# Patient Record
Sex: Female | Born: 1970 | Race: Black or African American | Hispanic: No | Smoking: Former smoker
Health system: Southern US, Community
[De-identification: ages and names within clinical notes are randomized; demographics above are authoritative.]

## PROBLEM LIST (undated history)

## (undated) ENCOUNTER — Ambulatory Visit: Discharge status: Absent without leave | Payer: BLUE CROSS/BLUE SHIELD

## (undated) DIAGNOSIS — T7840XA Allergy, unspecified, initial encounter: Secondary | ICD-10-CM

## (undated) DIAGNOSIS — O24419 Gestational diabetes mellitus in pregnancy, unspecified control: Secondary | ICD-10-CM

## (undated) DIAGNOSIS — F429 Obsessive-compulsive disorder, unspecified: Secondary | ICD-10-CM

## (undated) DIAGNOSIS — B999 Unspecified infectious disease: Secondary | ICD-10-CM

## (undated) DIAGNOSIS — G629 Polyneuropathy, unspecified: Secondary | ICD-10-CM

## (undated) DIAGNOSIS — N83209 Unspecified ovarian cyst, unspecified side: Secondary | ICD-10-CM

## (undated) DIAGNOSIS — J45909 Unspecified asthma, uncomplicated: Secondary | ICD-10-CM

## (undated) DIAGNOSIS — E785 Hyperlipidemia, unspecified: Secondary | ICD-10-CM

## (undated) DIAGNOSIS — E669 Obesity, unspecified: Secondary | ICD-10-CM

## (undated) DIAGNOSIS — E119 Type 2 diabetes mellitus without complications: Secondary | ICD-10-CM

## (undated) DIAGNOSIS — F32A Depression, unspecified: Secondary | ICD-10-CM

## (undated) DIAGNOSIS — F329 Major depressive disorder, single episode, unspecified: Secondary | ICD-10-CM

## (undated) DIAGNOSIS — F419 Anxiety disorder, unspecified: Secondary | ICD-10-CM

## (undated) DIAGNOSIS — K219 Gastro-esophageal reflux disease without esophagitis: Secondary | ICD-10-CM

## (undated) DIAGNOSIS — F988 Other specified behavioral and emotional disorders with onset usually occurring in childhood and adolescence: Secondary | ICD-10-CM

## (undated) HISTORY — DX: Other specified behavioral and emotional disorders with onset usually occurring in childhood and adolescence: F98.8

## (undated) HISTORY — DX: Unspecified asthma, uncomplicated: J45.909

## (undated) HISTORY — DX: Hyperlipidemia, unspecified: E78.5

## (undated) HISTORY — DX: Gastro-esophageal reflux disease without esophagitis: K21.9

## (undated) HISTORY — DX: Type 2 diabetes mellitus without complications: E11.9

## (undated) HISTORY — DX: Polyneuropathy, unspecified: G62.9

## (undated) HISTORY — DX: Major depressive disorder, single episode, unspecified: F32.9

## (undated) HISTORY — PX: WISDOM TOOTH EXTRACTION: SHX21

## (undated) HISTORY — DX: Depression, unspecified: F32.A

## (undated) HISTORY — DX: Obesity, unspecified: E66.9

## (undated) HISTORY — DX: Allergy, unspecified, initial encounter: T78.40XA

---

## 1998-05-02 ENCOUNTER — Encounter: Admission: RE | Admit: 1998-05-02 | Discharge: 1998-07-31 | Payer: Self-pay | Admitting: *Deleted

## 1998-08-01 ENCOUNTER — Other Ambulatory Visit: Admission: RE | Admit: 1998-08-01 | Discharge: 1998-08-01 | Payer: Self-pay | Admitting: *Deleted

## 1999-02-11 ENCOUNTER — Emergency Department (HOSPITAL_COMMUNITY): Admission: EM | Admit: 1999-02-11 | Discharge: 1999-02-12 | Payer: Self-pay | Admitting: Emergency Medicine

## 2000-07-05 ENCOUNTER — Encounter: Payer: Self-pay | Admitting: Emergency Medicine

## 2000-07-05 ENCOUNTER — Emergency Department (HOSPITAL_COMMUNITY): Admission: EM | Admit: 2000-07-05 | Discharge: 2000-07-05 | Payer: Self-pay | Admitting: Emergency Medicine

## 2000-08-02 ENCOUNTER — Ambulatory Visit (HOSPITAL_BASED_OUTPATIENT_CLINIC_OR_DEPARTMENT_OTHER): Admission: RE | Admit: 2000-08-02 | Discharge: 2000-08-02 | Payer: Self-pay | Admitting: Internal Medicine

## 2001-08-09 ENCOUNTER — Other Ambulatory Visit: Admission: RE | Admit: 2001-08-09 | Discharge: 2001-08-09 | Payer: Self-pay | Admitting: *Deleted

## 2001-08-21 ENCOUNTER — Encounter: Payer: Self-pay | Admitting: Obstetrics & Gynecology

## 2001-08-21 ENCOUNTER — Inpatient Hospital Stay (HOSPITAL_COMMUNITY): Admission: AD | Admit: 2001-08-21 | Discharge: 2001-08-21 | Payer: Self-pay | Admitting: Obstetrics & Gynecology

## 2002-04-07 ENCOUNTER — Other Ambulatory Visit: Admission: RE | Admit: 2002-04-07 | Discharge: 2002-04-07 | Payer: Self-pay | Admitting: Internal Medicine

## 2002-11-24 ENCOUNTER — Encounter: Admission: RE | Admit: 2002-11-24 | Discharge: 2002-11-24 | Payer: Self-pay | Admitting: Otolaryngology

## 2002-11-24 ENCOUNTER — Encounter: Payer: Self-pay | Admitting: Otolaryngology

## 2004-05-01 ENCOUNTER — Other Ambulatory Visit: Admission: RE | Admit: 2004-05-01 | Discharge: 2004-05-01 | Payer: Self-pay | Admitting: Internal Medicine

## 2004-12-29 ENCOUNTER — Ambulatory Visit (HOSPITAL_COMMUNITY): Admission: RE | Admit: 2004-12-29 | Discharge: 2004-12-29 | Payer: Self-pay | Admitting: Internal Medicine

## 2005-05-18 ENCOUNTER — Other Ambulatory Visit: Admission: RE | Admit: 2005-05-18 | Discharge: 2005-05-18 | Payer: Self-pay | Admitting: Internal Medicine

## 2006-05-16 ENCOUNTER — Emergency Department (HOSPITAL_COMMUNITY): Admission: EM | Admit: 2006-05-16 | Discharge: 2006-05-16 | Payer: Self-pay | Admitting: Emergency Medicine

## 2006-07-16 ENCOUNTER — Ambulatory Visit (HOSPITAL_COMMUNITY): Admission: RE | Admit: 2006-07-16 | Discharge: 2006-07-16 | Payer: Self-pay | Admitting: Internal Medicine

## 2006-09-23 ENCOUNTER — Ambulatory Visit: Payer: Self-pay | Admitting: *Deleted

## 2006-09-23 ENCOUNTER — Inpatient Hospital Stay (HOSPITAL_COMMUNITY): Admission: RE | Admit: 2006-09-23 | Discharge: 2006-09-25 | Payer: Self-pay | Admitting: *Deleted

## 2006-10-23 ENCOUNTER — Inpatient Hospital Stay (HOSPITAL_COMMUNITY): Admission: AD | Admit: 2006-10-23 | Discharge: 2006-10-23 | Payer: Self-pay | Admitting: Obstetrics and Gynecology

## 2006-10-24 ENCOUNTER — Emergency Department (HOSPITAL_COMMUNITY): Admission: EM | Admit: 2006-10-24 | Discharge: 2006-10-24 | Payer: Self-pay | Admitting: Emergency Medicine

## 2007-05-10 ENCOUNTER — Other Ambulatory Visit: Admission: RE | Admit: 2007-05-10 | Discharge: 2007-05-10 | Payer: Self-pay | Admitting: Internal Medicine

## 2007-08-08 ENCOUNTER — Emergency Department (HOSPITAL_COMMUNITY): Admission: EM | Admit: 2007-08-08 | Discharge: 2007-08-08 | Payer: Self-pay | Admitting: *Deleted

## 2007-12-10 ENCOUNTER — Emergency Department (HOSPITAL_COMMUNITY): Admission: EM | Admit: 2007-12-10 | Discharge: 2007-12-10 | Payer: Self-pay | Admitting: Emergency Medicine

## 2008-02-13 ENCOUNTER — Emergency Department (HOSPITAL_COMMUNITY): Admission: EM | Admit: 2008-02-13 | Discharge: 2008-02-13 | Payer: Self-pay | Admitting: Emergency Medicine

## 2008-07-19 ENCOUNTER — Emergency Department (HOSPITAL_COMMUNITY): Admission: EM | Admit: 2008-07-19 | Discharge: 2008-07-19 | Payer: Self-pay | Admitting: Emergency Medicine

## 2008-09-30 ENCOUNTER — Inpatient Hospital Stay (HOSPITAL_COMMUNITY): Admission: AD | Admit: 2008-09-30 | Discharge: 2008-09-30 | Payer: Self-pay | Admitting: Obstetrics

## 2009-03-19 ENCOUNTER — Encounter: Admission: RE | Admit: 2009-03-19 | Discharge: 2009-03-19 | Payer: Self-pay | Admitting: Obstetrics and Gynecology

## 2009-05-25 ENCOUNTER — Inpatient Hospital Stay (HOSPITAL_COMMUNITY): Admission: AD | Admit: 2009-05-25 | Discharge: 2009-05-27 | Payer: Self-pay | Admitting: Obstetrics and Gynecology

## 2009-07-19 HISTORY — PX: OOPHORECTOMY: SHX86

## 2009-08-05 ENCOUNTER — Ambulatory Visit (HOSPITAL_COMMUNITY): Admission: RE | Admit: 2009-08-05 | Discharge: 2009-08-06 | Payer: Self-pay | Admitting: Obstetrics and Gynecology

## 2009-08-05 ENCOUNTER — Encounter (INDEPENDENT_AMBULATORY_CARE_PROVIDER_SITE_OTHER): Payer: Self-pay | Admitting: Obstetrics and Gynecology

## 2009-11-20 ENCOUNTER — Ambulatory Visit (HOSPITAL_COMMUNITY): Admission: RE | Admit: 2009-11-20 | Discharge: 2009-11-20 | Payer: Self-pay | Admitting: Obstetrics and Gynecology

## 2010-04-18 ENCOUNTER — Emergency Department (HOSPITAL_COMMUNITY): Admission: EM | Admit: 2010-04-18 | Discharge: 2010-04-18 | Payer: Self-pay | Admitting: Emergency Medicine

## 2011-01-01 ENCOUNTER — Other Ambulatory Visit (HOSPITAL_COMMUNITY): Payer: Self-pay | Admitting: Internal Medicine

## 2011-01-01 DIAGNOSIS — Z1231 Encounter for screening mammogram for malignant neoplasm of breast: Secondary | ICD-10-CM

## 2011-01-02 ENCOUNTER — Ambulatory Visit (HOSPITAL_COMMUNITY)
Admission: RE | Admit: 2011-01-02 | Discharge: 2011-01-02 | Disposition: A | Discharge status: Home / Self Care | Payer: PRIVATE HEALTH INSURANCE | Source: Ambulatory Visit | Attending: Internal Medicine | Admitting: Internal Medicine

## 2011-01-02 DIAGNOSIS — Z1231 Encounter for screening mammogram for malignant neoplasm of breast: Secondary | ICD-10-CM | POA: Insufficient documentation

## 2011-01-08 ENCOUNTER — Other Ambulatory Visit: Payer: Self-pay | Admitting: Internal Medicine

## 2011-01-08 DIAGNOSIS — R928 Other abnormal and inconclusive findings on diagnostic imaging of breast: Secondary | ICD-10-CM

## 2011-01-14 ENCOUNTER — Ambulatory Visit
Admission: RE | Admit: 2011-01-14 | Discharge: 2011-01-14 | Disposition: A | Discharge status: Home / Self Care | Payer: PRIVATE HEALTH INSURANCE | Source: Ambulatory Visit | Attending: Internal Medicine | Admitting: Internal Medicine

## 2011-01-14 DIAGNOSIS — R928 Other abnormal and inconclusive findings on diagnostic imaging of breast: Secondary | ICD-10-CM

## 2011-01-22 LAB — CBC
HCT: 33 % — ABNORMAL LOW (ref 36.0–46.0)
HCT: 36.9 % (ref 36.0–46.0)
Hemoglobin: 11 g/dL — ABNORMAL LOW (ref 12.0–15.0)
Hemoglobin: 12.2 g/dL (ref 12.0–15.0)
MCHC: 33.3 g/dL (ref 30.0–36.0)
MCHC: 33.1 g/dL (ref 30.0–36.0)
MCV: 82.6 fL (ref 78.0–100.0)
MCV: 82.8 fL (ref 78.0–100.0)
Platelets: 248 K/uL (ref 150–400)
Platelets: 258 K/uL (ref 150–400)
RBC: 4 MIL/uL (ref 3.87–5.11)
RBC: 4.46 MIL/uL (ref 3.87–5.11)
RDW: 14.4 % (ref 11.5–15.5)
RDW: 14.7 % (ref 11.5–15.5)
WBC: 10.8 K/uL — ABNORMAL HIGH (ref 4.0–10.5)
WBC: 6.2 K/uL (ref 4.0–10.5)

## 2011-01-22 LAB — BASIC METABOLIC PANEL WITH GFR
BUN: 9 mg/dL (ref 6–23)
CO2: 28 meq/L (ref 19–32)
Calcium: 8.5 mg/dL (ref 8.4–10.5)
Chloride: 104 meq/L (ref 96–112)
Creatinine, Ser: 0.75 mg/dL (ref 0.4–1.2)
GFR calc non Af Amer: >60 mL/min
Glucose, Bld: 115 mg/dL — ABNORMAL HIGH (ref 70–99)
Potassium: 4.1 meq/L (ref 3.5–5.1)
Sodium: 136 meq/L (ref 135–145)

## 2011-01-22 LAB — PREGNANCY, URINE: Preg Test, Ur: NEGATIVE

## 2011-01-24 LAB — GLUCOSE, CAPILLARY
Glucose-Capillary: 117 mg/dL — ABNORMAL HIGH (ref 70–99)
Glucose-Capillary: 71 mg/dL (ref 70–99)

## 2011-01-24 LAB — CBC
HCT: 33.1 % — ABNORMAL LOW (ref 36.0–46.0)
Hemoglobin: 12.2 g/dL (ref 12.0–15.0)
MCV: 83.7 fL (ref 78.0–100.0)
Platelets: 200 10*3/uL (ref 150–400)
RBC: 3.92 MIL/uL (ref 3.87–5.11)
RDW: 14.9 % (ref 11.5–15.5)

## 2011-01-24 LAB — RPR: RPR Ser Ql: NONREACTIVE

## 2011-03-03 NOTE — Discharge Summary (Signed)
Bridget Watson, Bridget Watson              ACCOUNT NO.:  [id number]   MEDICAL RECORD NO.:  [id number]          PATIENT TYPE:  INP   LOCATION:  9117                          FACILITY:  [facility]   PHYSICIAN:  Sherron Monday, MD        DATE OF BIRTH:  03/08/1971   DATE OF ADMISSION:  05/25/2009  DATE OF DISCHARGE:  05/27/2009                               DISCHARGE SUMMARY   ADMITTING DIAGNOSIS:  Intrauterine pregnancy at term in labor.   DISCHARGE DIAGNOSIS:  Intrauterine pregnancy at term in labor,  delivered.   HISTORY OF PRESENT ILLNESS:  A 40 year old, G5, P2-0-2-2, at 1 plus  weeks in labor.  She states she has had good fetal movement, no loss of  fluid, no vaginal bleeding, and contractions since approximately 8:00  p.m., increasing in intensity and frequency.  Her pregnancy has been  complicated by gestational diabetes mellitus and AMA.  She is on  glyburide for the gestational diabetes and has had fairly good control.   Past medical history is significant for anxiety, low back pain, and  morbid obesity.   Past surgical history is significant for a cyst removed from her axilla.   PAST GYNECOLOGIC HISTORY:  G1 was an 9 of 7-pound 11-ounce female  infant.  G2 was a miscarriage.  G3 was in 1996, 8-pound 5-ounce female  infant, pregnancy complicated by deceased from diabetes and preterm  labor.  G4 is a miscarriage.  G5 is the present pregnancy.  No abnormal  Pap smears or sexually transmitted diseases.   Medications include glyburide, prenatal vitamins, and Protonix.   Allergies are to [facility].   SOCIAL HISTORY:  Denies alcohol or drugs.  Admits to smoking, but quit  with the pregnancy.  She is married.   Family history is significant for coronary artery disease in the mother,  hypertension in mother and grandmother, anemia in mother, diabetes in  the son, mother, and 2 brothers, ovarian cancer in a sister, breast  cancer in a sister, father with colon cancer, mother with depression.   PRENATAL LABORATORY DATA:  Hemoglobin 11.9, platelets 259,000.  A  positive.  Antibody screen negative.  Group B strep positive.  Gonorrhea  negative.  Chlamydia negative.  RPR nonreactive.  Rubella immune.  Declines cystic fibrosis screening.  Hepatitis B surface antigen  negative.  HIV negative.  First trimester screen and AFP within normal  limits.  Early 1-hour Glucola of 180 with an elevated 3-hour.  Decision  was made to follow sugars given her history and her care, she had on  several ultrasounds normal nuchal thickness.  An 18-week ultrasound  revealed normal anatomy, limited heart anatomy, low lying placenta, and  a female infant.  Repeat scan at 32 weeks revealed normal AFI, posterior  placenta resolved, low lying.   On admission, afebrile, vital signs stable with normal CBG.  Benign  exam.  Fetal heart tones in the 130s with irregular contractions.  She  had SROM at 6:00 a.m. for light  Mec.  She was given penicillin for  group B strep prophylaxis.  She had her epidural placed, and  the baby  had decel secondary to the hypotension.  At this time, she responded  well to position change and ephedrine.  She was 9-10, completely  effaced, and +1 station.  She progressed rapidly to complete +3, pushed  for several contractions and she delivered a viable female infant at 7:56  a.m. with Apgars of 9 at 1 minute and 9 at 5 minutes and a weight of 7  pounds 7 ounces.  Placenta was manually extracted at 8:02, inspected and  found to be complete.  OB laceration was periclitoral, repaired with 3-0  Vicryl Rapide in the typical fashion.  Estimated blood loss was less  than 500 mL.  She received Zosyn for the manual extraction of placenta  for 24 hours.  Her postpartum course was relatively uncomplicated.  She  remained afebrile, and vital signs stable throughout.  Her hemoglobin  decreased from 12.2 to 11.3.  Her fingersticks were followed and were  within normal limits.  She will be discharged  home.  She states she has  had normal lochia.  Her pain is well controlled.  She is ambulating,  voiding without difficulty.  She was given routine discharge  instructions and numbers to call with any questions or problems.  She  voices understanding to all this.  She was given prescriptions for  Motrin, Vicodin, prenatal vitamins.  She will follow up in the office in  approximately 6 weeks.  At this time, we will perform a 2-hour GCT.  She  plans to both breast and bottle feed, use Depo for contraception.  She  will receive her first injection prior to discharge.  She is A positive,  rubella immune.  Hemoglobin decreased from 12.2 to 11.3.      Sherron Monday, MD  Electronically Signed     JB/MEDQ  D:  05/27/2009  T:  05/28/2009  Job:  [id number]

## 2011-03-06 NOTE — Discharge Summary (Signed)
Bridget Watson, Bridget Watson NO.:  [id number]   MEDICAL RECORD NO.:  [id number]          PATIENT TYPE:  IPS   LOCATION:  0301                          FACILITY:  [facility]   PHYSICIAN:  Jasmine Pang, M.D. DATE OF BIRTH:  March 30, 1971   DATE OF ADMISSION:  09/23/2006  DATE OF DISCHARGE:  09/25/2006                               DISCHARGE SUMMARY   IDENTIFICATION:  This is a 40 year old divorced African-American female  who was admitted on a voluntary basis on September 23, 2006.   HISTORY OF PRESENT ILLNESS:  The patient has a history of increased  stress.  She is overwhelmed with work.  She has broken up with a  boyfriend who is getting ready to be deployed to [place].  She has been  having suicidal ideations.  She is tired of being the strong me.  She  is tired of fighting (with her boyfriend).  She reported no specific  plan but had just begun to have some suicidal ideation.  Her children  are her deterrent.  She has two children.  She has been sleeping 3-4  hours a day.  She takes on extra work where she works at an [employer] and stays very busy.  Today, she took some extra Xanax to cope  but just to cope.  Recently, there has been an increase in alcohol  intake in the past 3-4 months.  She has approximately one gallon per  week.  Her last drink was the day before admission.  This is her first  [facility] admission.  She has no outpatient treatment.  She is on Xanax (?  dose) and had been on Prozac in the past.  She had been on Wellbutrin at  one point and stated this worked better but she could not afford it.  She was willing to go back on it, however.  For further admission  information, see psychiatric admission assessment.   PHYSICAL EXAMINATION:  The patient had no acute medical problems.  She  was a healthy female who was obese (5 feet 2 inches and 233.75 pounds).   LABORATORY DATA:  CBC was within normal limits.  CMET was within normal  limits.  TSH was 1.105  which was within normal limits.   HOSPITAL COURSE:  Upon admission, the patient was started on Wellbutrin  XL 150 mg p.o. q.d.  She was also started on the Librium detox protocol  and trazodone 50 mg p.o. q.h.s. p.r.n. q.6h. while awake.  The patient  tolerated her medications well with no significant side effects.  When I  met with the patient, on September 24, 2006, she discussed numerous  stressors, work, school, being a single mother, boyfriend being deployed  to [place], conflict between them.  She also said her sister has terminal  ovarian cancer.  After an argument with her boyfriend on the day of  admission, she called her [facility] program.  She told them she had suicidal  thoughts but denies being actively suicidal.  EAP had the EMTs come to  take her to the emergency room.  She stated she has  not been sleeping  well and she has had a decreased appetite.  On September 25, 2006, mental  status exam had improved markedly.  She was friendly, cooperative with  good eye contact.  Speech normal rate and flow.  Psychomotor activity  within normal limits.  Mood was euthymic.  Affect wide range.  There was  no suicidal or homicidal ideation.  No thoughts of self-injurious  behavior.  No auditory or visual hallucinations.  No paranoia or  delusions.  Thoughts were logical and goal-directed.  Thought content no  predominant theme.  The cognitive exam was within normal limits, back to  baseline.  It was felt the patient was stable enough to be discharged  today and begin treatment on an outpatient basis at the [facility].   DISCHARGE DIAGNOSES:  AXIS I:  Major depressive disorder, single  episode, severe without psychosis.  Alcohol abuse.  AXIS II:  None.  AXIS III:  No acute or chronic health problems.  AXIS IV:  Severe (problems with primary support group, problems related  to social environment, occupational problem, economic problem, other  psychosocial problems).  AXIS  V:   ACTIVITY/DIET:  There were no specific activity level or dietary  restrictions.   DISCHARGE MEDICATIONS:  1. Wellbutrin XL 300 mg p.o. daily.  2. Librium 25 mg, 1 pill twice daily on Sunday, September 26, 2006; then      1 pill x1 on Monday, September 27, 2006, then discontinue.   POST-HOSPITAL CARE PLANS:  The patient will be seen at the [facility] by Saul Fordyce, nurse practitioner on Friday,  October 01, 2006 at 9 a.m. for her medication management.  Once there,  they will schedule her with a therapist in their office to begin regular  therapy.      Jasmine Pang, M.D.  Electronically Signed     BHS/MEDQ  D:  09/25/2006  T:  09/25/2006  Job:  [id number]

## 2011-07-23 LAB — CBC
HCT: 36.5 % (ref 36.0–46.0)
MCV: 84.6 fL (ref 78.0–100.0)
RBC: 4.31 MIL/uL (ref 3.87–5.11)
WBC: 7.9 10*3/uL (ref 4.0–10.5)

## 2011-07-23 LAB — ABO/RH: ABO/RH(D): A POS

## 2011-12-17 ENCOUNTER — Other Ambulatory Visit (HOSPITAL_COMMUNITY): Payer: Self-pay | Admitting: Internal Medicine

## 2011-12-17 DIAGNOSIS — Z1231 Encounter for screening mammogram for malignant neoplasm of breast: Secondary | ICD-10-CM

## 2012-01-15 ENCOUNTER — Ambulatory Visit
Admission: RE | Admit: 2012-01-15 | Discharge: 2012-01-15 | Disposition: A | Discharge status: Home / Self Care | Payer: PRIVATE HEALTH INSURANCE | Source: Ambulatory Visit | Attending: Internal Medicine | Admitting: Internal Medicine

## 2012-01-15 DIAGNOSIS — Z1231 Encounter for screening mammogram for malignant neoplasm of breast: Secondary | ICD-10-CM

## 2012-05-17 ENCOUNTER — Telehealth: Payer: Self-pay | Admitting: *Deleted

## 2012-05-17 NOTE — Telephone Encounter (Signed)
Left message for pt to return my call so I can schedule a genetic appt.  

## 2012-05-23 ENCOUNTER — Telehealth: Payer: Self-pay | Admitting: *Deleted

## 2012-05-23 NOTE — Telephone Encounter (Signed)
Left message for pt to return my call so I can schedule a genetic appt.  

## 2012-05-24 ENCOUNTER — Telehealth: Payer: Self-pay | Admitting: *Deleted

## 2012-05-24 NOTE — Telephone Encounter (Signed)
Patient returned my call and I confirmed 07/21/12 genetic appt w/ pt.  Called Brandi at referring to make her aware.  Took paperwork to Clydie Braun.

## 2012-07-21 ENCOUNTER — Encounter: Payer: Self-pay | Admitting: Genetic Counselor

## 2012-07-21 ENCOUNTER — Ambulatory Visit (HOSPITAL_BASED_OUTPATIENT_CLINIC_OR_DEPARTMENT_OTHER): Payer: PRIVATE HEALTH INSURANCE | Admitting: Genetic Counselor

## 2012-07-21 ENCOUNTER — Other Ambulatory Visit: Payer: BC Managed Care – PPO | Admitting: Lab

## 2012-07-21 DIAGNOSIS — Z8041 Family history of malignant neoplasm of ovary: Secondary | ICD-10-CM

## 2012-07-21 DIAGNOSIS — IMO0002 Reserved for concepts with insufficient information to code with codable children: Secondary | ICD-10-CM

## 2012-07-21 DIAGNOSIS — Z803 Family history of malignant neoplasm of breast: Secondary | ICD-10-CM

## 2012-07-21 NOTE — Progress Notes (Signed)
Dr. Sherron Monday requested a consultation for genetic counseling and risk assessment for Bridget Watson, a 41 y.o. female, for discussion of her family history of breast, ovarian, prostate and stomach cancer. She presents to clinic today to discuss the possibility of a genetic predisposition to cancer, and to further clarify her risks, as well as her family members' risks for cancer.   HISTORY OF PRESENT ILLNESS: Bridget Watson is a 41 y.o. female with no personal history of cancer.    History reviewed. No pertinent past medical history.  History reviewed. No pertinent past surgical history.  History  Substance Use Topics  . Smoking status: Current Some Day Smoker -- 0.2 packs/day    Types: Cigarettes  . Smokeless tobacco: Not on file  . Alcohol Use: 2.4 oz/week    4 Glasses of wine per week     4-5 glasses of wine per week    REPRODUCTIVE HISTORY AND PERSONAL RISK ASSESSMENT FACTORS: Menarche was at age 15-12.   Premenopausal Uterus Intact: Yes Ovaries Intact: Yes, right ovary; left ovary removed as a result of ovarian cysts G3P3A0 , first live birth at age 8  She has not previously undergone treatment for infertility.   OCP use for 10 years, and depo-provera use for 10 years   She has not used HRT in the past.    FAMILY HISTORY:  We obtained a detailed, 4-generation family history.  Significant diagnoses are listed below: Family History  Problem Relation Age of Onset  . Breast cancer Sister 79  . Breast cancer Sister     late 56s  . Prostate cancer Father   . Healthy Sister   . Stomach cancer Brother 18  The patient has five brothers and three sisters.  One sister was diagnosed with breast cancer at age 62 and died at 36, another sister was diagnosed with ovarian cancer in her late 19s and died at 84, and her third sister is healthy.  One of her brothers has been diagnosed with stomach cancer at age 39-48.  The patient's mother is alive and well at age 60.  Her mother  has two brothers and a sister.  One brother had an unknown form of cancer and died a few years ago.  The patient's father was diagnosed with prostate cancer and died in his 24s.  Of note, the patient indicated that it is unclear if he is her biological father.  He had 11 brothers and sisters.  There is no other reported family history of cancer on this side of the family.  Patient's maternal ancestors are of [place] descent, and paternal ancestors are of [place] descent. There is no reported Ashkenazi Jewish ancestry. There is no  known consanguinity.  GENETIC COUNSELING RISK ASSESSMENT, DISCUSSION, AND SUGGESTED FOLLOW UP: We reviewed the natural history and genetic etiology of sporadic, familial and hereditary cancer syndromes.  About 5-10% of breast cancer is hereditary.  Of this, about 85% is the result of a BRCA1 or BRCA2 mutation.  We reviewed the red flags of hereditary cancer syndromes and the dominant inheritance patterns.  If the BRCA testing is negative, we discussed that we could be testing for the wrong gene.  We discussed gene panels, and that several cancer genes that are associated with different cancers can be tested at the same time.  Because of the different types of cancer that are in the patient's family, we will consider the CancerNext panel. We  will also consider a referral to the high risk clinic based on her lifetime risk for breast cancer.  The patient's family history of breast, ovarian, prostate and stomach cancer is suggestive of the following possible diagnosis: Hereditary cancer syndrome  We discussed that identification of a hereditary cancer syndrome may help her care providers tailor the patients medical management. If a mutation indicating a hereditary cancer syndrome is detected in this case, the [employer] recommendations would include increased cancer surveillance and possible  prophylactic surgery. If a mutation is detected, the patient will be referred back to the referring provider and to any additional appropriate care providers to discuss the relevant options.   If a mutation is not found in the patient, this will decrease the likelihood of a hereditary cancer syndrome as the explanation for her family history. Cancer surveillance options would be discussed for the patient according to the appropriate standard National Comprehensive Cancer Network and American Cancer Society guidelines, with consideration of their personal and family history risk factors. In this case, the patient will be referred back to their care providers for discussions of management.   In order to estimate her chance of having a BRCA1 or BRCA2 mutation, we used statistical models ([employer]) and laboratory data that take into account her personal medical history, family history and ancestry.  Because each model is different, there can be a lot of variability in the risks they give.  Therefore, these numbers must be considered a rough range and not a precise risk of having a BRCA1 or BRCA2 mutation.  These models estimate that she has approximately a 7-10% chance of having a mutation. Based on this assessment of her family and personal history, genetic testing is recommended.  Based on the patient's personal and family history, statistical models (Tyrer cusik)  and literature data were used to estimate her risk of developing breast cancer. These estimate her lifetime risk of developing breast cancer to be approximately 21.1%. This estimation does not take into account any genetic testing results.   After considering the risks, benefits, and limitations, the patient provided informed consent for  the following  testing: CancerNext through [employer]   Per the patient's request, we will contact her by telephone to discuss these results. A follow up genetic counseling visit will be  scheduled if indicated.  The patient was seen for a total of 60 minutes, greater than 50% of which was spent face-to-face counseling.  This plan is being carried out per Dr. Augusto Gamble Bovard's recommendations.  This note will also be sent to the referring provider via the electronic medical record. The patient will be supplied with a summary of this genetic counseling discussion as well as educational information on the discussed hereditary cancer syndromes following the conclusion of their visit.   Patient was discussed with Dr. Drue Second.  CC BY [place] MAIL: Sherron Monday, MD, [facility]   _______________________________________________________________________ For Office Staff:  Number of people involved in session: 2 Was an Intern/ student involved with case: no

## 2012-08-10 ENCOUNTER — Other Ambulatory Visit: Payer: Self-pay

## 2012-10-21 ENCOUNTER — Telehealth: Payer: Self-pay | Admitting: Genetic Counselor

## 2012-10-21 NOTE — Telephone Encounter (Signed)
Left message to call me back about test results.  No name on VM.

## 2012-10-28 ENCOUNTER — Encounter: Payer: Self-pay | Admitting: Genetic Counselor

## 2013-06-28 ENCOUNTER — Other Ambulatory Visit (HOSPITAL_COMMUNITY): Payer: Self-pay | Admitting: Internal Medicine

## 2013-06-28 ENCOUNTER — Ambulatory Visit (HOSPITAL_COMMUNITY)
Admission: RE | Admit: 2013-06-28 | Discharge: 2013-06-28 | Disposition: A | Discharge status: Home / Self Care | Source: Ambulatory Visit | Attending: Internal Medicine | Admitting: Internal Medicine

## 2013-06-28 DIAGNOSIS — M25519 Pain in unspecified shoulder: Secondary | ICD-10-CM | POA: Insufficient documentation

## 2013-06-28 DIAGNOSIS — M25512 Pain in left shoulder: Secondary | ICD-10-CM

## 2013-06-28 DIAGNOSIS — Q740 Other congenital malformations of upper limb(s), including shoulder girdle: Secondary | ICD-10-CM | POA: Insufficient documentation

## 2013-08-24 ENCOUNTER — Telehealth: Payer: Self-pay | Admitting: *Deleted

## 2013-08-24 MED ORDER — FLUVOXAMINE MALEATE ER 150 MG PO CP24: 150.0000 mg | ORAL_CAPSULE | Freq: Every day | ORAL | Status: DC

## 2013-08-24 MED ORDER — ESCITALOPRAM OXALATE 20 MG PO TABS: 20.0000 mg | ORAL_TABLET | Freq: Every day | ORAL | Status: DC

## 2013-08-24 MED ORDER — BUPROPION HCL ER (XL) 150 MG PO TB24: 150.0000 mg | ORAL_TABLET | ORAL | Status: DC

## 2013-08-24 NOTE — Telephone Encounter (Signed)
PT IS CALLING WOULD LIKE TO PICK UP WRITTEN RX'S FOR =  WELLBUTRIN 150MG   LEXAPRO 20MG   FLUVOXAMINE ER 150MG                             CALL PT WHEN READY

## 2013-10-04 ENCOUNTER — Encounter: Payer: Self-pay | Admitting: Internal Medicine

## 2013-10-04 DIAGNOSIS — T7840XA Allergy, unspecified, initial encounter: Secondary | ICD-10-CM | POA: Insufficient documentation

## 2013-10-04 DIAGNOSIS — F909 Attention-deficit hyperactivity disorder, unspecified type: Secondary | ICD-10-CM | POA: Insufficient documentation

## 2013-10-04 DIAGNOSIS — G629 Polyneuropathy, unspecified: Secondary | ICD-10-CM | POA: Insufficient documentation

## 2013-10-04 DIAGNOSIS — E785 Hyperlipidemia, unspecified: Secondary | ICD-10-CM | POA: Insufficient documentation

## 2013-10-04 DIAGNOSIS — E119 Type 2 diabetes mellitus without complications: Secondary | ICD-10-CM | POA: Insufficient documentation

## 2013-10-04 DIAGNOSIS — F329 Major depressive disorder, single episode, unspecified: Secondary | ICD-10-CM | POA: Insufficient documentation

## 2013-10-04 DIAGNOSIS — J45909 Unspecified asthma, uncomplicated: Secondary | ICD-10-CM | POA: Insufficient documentation

## 2013-10-04 DIAGNOSIS — K219 Gastro-esophageal reflux disease without esophagitis: Secondary | ICD-10-CM | POA: Insufficient documentation

## 2013-10-04 DIAGNOSIS — I10 Essential (primary) hypertension: Secondary | ICD-10-CM | POA: Insufficient documentation

## 2013-10-05 ENCOUNTER — Encounter: Payer: Self-pay | Admitting: Physician Assistant

## 2013-10-05 ENCOUNTER — Ambulatory Visit (INDEPENDENT_AMBULATORY_CARE_PROVIDER_SITE_OTHER): Admitting: Physician Assistant

## 2013-10-05 VITALS — BP 132/78 | HR 88 | Temp 98.1°F | Resp 18 | Ht 63.5 in | Wt 298.0 lb

## 2013-10-05 DIAGNOSIS — J019 Acute sinusitis, unspecified: Secondary | ICD-10-CM

## 2013-10-05 MED ORDER — AZITHROMYCIN 250 MG PO TABS: ORAL_TABLET | ORAL | Status: DC

## 2013-10-05 MED ORDER — DEXAMETHASONE SODIUM PHOSPHATE 100 MG/10ML IJ SOLN
10.0000 mg | Freq: Once | INTRAMUSCULAR | Status: AC
  Administered 2013-10-05: 10 mg via INTRAMUSCULAR

## 2013-10-05 MED ORDER — AZELASTINE HCL 0.1 % NA SOLN: NASAL | Status: DC

## 2013-10-05 NOTE — Progress Notes (Signed)
   Subjective:    Patient ID: Bridget Watson, female    DOB: 1971/10/13, 42 y.o.   MRN: [id number]  Sinus Problem This is a new problem. The current episode started in the past 7 days. The problem is unchanged. There has been no fever. Associated symptoms include congestion, coughing, ear pain, a hoarse voice and sinus pressure. Pertinent negatives include no chills, diaphoresis, headaches, neck pain, shortness of breath, sneezing, sore throat or swollen glands. Past treatments include oral decongestants (RX cough med). The treatment provided no relief.    Review of Systems  Constitutional: Negative for fever, chills, diaphoresis and fatigue.  HENT: Positive for congestion, ear pain, hoarse voice, nosebleeds, postnasal drip, rhinorrhea, sinus pressure and tinnitus. Negative for sneezing, sore throat and trouble swallowing.   Eyes: Negative.   Respiratory: Positive for cough. Negative for shortness of breath, wheezing and stridor.   Cardiovascular: Negative.   Gastrointestinal: Negative.   Musculoskeletal: Negative.  Negative for neck pain.  Neurological: Negative.  Negative for headaches.      Objective:   Physical Exam  Constitutional: She appears well-developed and well-nourished.  HENT:  Head: Normocephalic and atraumatic.  Right Ear: External ear normal.  Nose: Right sinus exhibits frontal sinus tenderness. Left sinus exhibits frontal sinus tenderness.  Eyes: Conjunctivae and EOM are normal.  Neck: Normal range of motion. Neck supple.  Cardiovascular: Normal rate, regular rhythm, normal heart sounds and intact distal pulses.   Pulmonary/Chest: Effort normal and breath sounds normal. No respiratory distress. She has no wheezes.  Abdominal: Soft. Bowel sounds are normal.  Skin: Skin is warm and dry.      Assessment & Plan:  Acute sinusitis, unspecified - Plan: azithromycin (ZITHROMAX) 250 MG tablet, azelastine (ASTELIN) 137 MCG/SPRAY nasal spray, dexamethasone (DECADRON)  injection 10 mg

## 2013-10-05 NOTE — Patient Instructions (Signed)

## 2013-10-23 ENCOUNTER — Other Ambulatory Visit: Payer: Self-pay | Admitting: Orthopedic Surgery

## 2013-10-23 DIAGNOSIS — M25512 Pain in left shoulder: Secondary | ICD-10-CM

## 2013-10-25 ENCOUNTER — Inpatient Hospital Stay: Admission: RE | Admit: 2013-10-25 | Payer: PRIVATE HEALTH INSURANCE | Source: Ambulatory Visit

## 2013-11-29 ENCOUNTER — Telehealth: Payer: Self-pay | Admitting: Emergency Medicine

## 2013-11-29 NOTE — Telephone Encounter (Signed)
Pt called today-11-29-2012 at 3;30=she went to [facility] today for appt on gastro by pass and while she was there she started having chest pains and they told her to go to the urgent care there and they did a EKG and told her to call her family doctor and get appt with them and then go to an cardio doctor=she does not have a cardio doctor= She was calling for the appt with amanda collier and asked pt was she still having chest pains and she said her chest still feels tight=talked to Loree Feem[place]issa smith and [place] said to make her appt tomarrow morning with us=made appt with Quentin Mullingamanda collier on 11/30/2013 at 9;30am. Loree FeeMelissa smith said for me(janie) to tell the pt if she still keeps having chest pains and/or if they get worst to go to the ER. TOLD PT THIS AND SHE  SAID SHE WOULD..Marland Kitchen

## 2013-11-30 ENCOUNTER — Encounter: Payer: Self-pay | Admitting: Physician Assistant

## 2013-11-30 ENCOUNTER — Ambulatory Visit (INDEPENDENT_AMBULATORY_CARE_PROVIDER_SITE_OTHER): Admitting: Physician Assistant

## 2013-11-30 VITALS — BP 116/64 | HR 98 | Temp 98.4°F | Resp 18 | Wt 296.0 lb

## 2013-11-30 DIAGNOSIS — R079 Chest pain, unspecified: Secondary | ICD-10-CM

## 2013-11-30 DIAGNOSIS — R0600 Dyspnea, unspecified: Secondary | ICD-10-CM

## 2013-11-30 DIAGNOSIS — F411 Generalized anxiety disorder: Secondary | ICD-10-CM

## 2013-11-30 DIAGNOSIS — K219 Gastro-esophageal reflux disease without esophagitis: Secondary | ICD-10-CM

## 2013-11-30 DIAGNOSIS — R0989 Other specified symptoms and signs involving the circulatory and respiratory systems: Secondary | ICD-10-CM

## 2013-11-30 DIAGNOSIS — R0609 Other forms of dyspnea: Secondary | ICD-10-CM

## 2013-11-30 DIAGNOSIS — I10 Essential (primary) hypertension: Secondary | ICD-10-CM

## 2013-11-30 LAB — BASIC METABOLIC PANEL WITH GFR
BUN: 12 mg/dL (ref 6–23)
CO2: 28 meq/L (ref 19–32)
Calcium: 9 mg/dL (ref 8.4–10.5)
Chloride: 109 mEq/L (ref 96–112)
Creat: 0.88 mg/dL (ref 0.50–1.10)
GFR, Est Non African American: 82 mL/min
GLUCOSE: 87 mg/dL (ref 70–99)
POTASSIUM: 4.1 meq/L (ref 3.5–5.3)
SODIUM: 142 meq/L (ref 135–145)

## 2013-11-30 LAB — CBC WITH DIFFERENTIAL/PLATELET
BASOS PCT: 0 % (ref 0–1)
Basophils Absolute: 0 10*3/uL (ref 0.0–0.1)
Eosinophils Absolute: 0.1 10*3/uL (ref 0.0–0.7)
Eosinophils Relative: 2 % (ref 0–5)
HCT: 37.5 % (ref 36.0–46.0)
HEMOGLOBIN: 12.4 g/dL (ref 12.0–15.0)
LYMPHS ABS: 3.1 10*3/uL (ref 0.7–4.0)
Lymphocytes Relative: 43 % (ref 12–46)
MCH: 26.7 pg (ref 26.0–34.0)
MCHC: 33.1 g/dL (ref 30.0–36.0)
MCV: 80.8 fL (ref 78.0–100.0)
MONOS PCT: 6 % (ref 3–12)
Monocytes Absolute: 0.4 10*3/uL (ref 0.1–1.0)
NEUTROS ABS: 3.6 10*3/uL (ref 1.7–7.7)
NEUTROS PCT: 49 % (ref 43–77)
Platelets: 312 10*3/uL (ref 150–400)
RBC: 4.64 MIL/uL (ref 3.87–5.11)
RDW: 14.7 % (ref 11.5–15.5)
WBC: 7.2 10*3/uL (ref 4.0–10.5)

## 2013-11-30 LAB — HEPATIC FUNCTION PANEL
ALT: 11 U/L (ref 0–35)
AST: 12 U/L (ref 0–37)
Albumin: 3.8 g/dL (ref 3.5–5.2)
Alkaline Phosphatase: 43 U/L (ref 39–117)
BILIRUBIN DIRECT: 0.1 mg/dL (ref 0.0–0.3)
BILIRUBIN INDIRECT: 0.2 mg/dL (ref 0.2–1.2)
BILIRUBIN TOTAL: 0.3 mg/dL (ref 0.2–1.2)
Total Protein: 6.5 g/dL (ref 6.0–8.3)

## 2013-11-30 MED ORDER — ALPRAZOLAM 1 MG PO TABS: 1.0000 mg | ORAL_TABLET | Freq: Three times a day (TID) | ORAL | Status: DC | PRN

## 2013-11-30 MED ORDER — NITROGLYCERIN 0.4 MG SL SUBL: 0.4000 mg | SUBLINGUAL_TABLET | SUBLINGUAL | Status: DC | PRN

## 2013-11-30 MED ORDER — BUPROPION HCL ER (XL) 300 MG PO TB24: 300.0000 mg | ORAL_TABLET | ORAL | Status: DC

## 2013-11-30 MED ORDER — MELOXICAM 15 MG PO TABS: ORAL_TABLET | ORAL | Status: DC

## 2013-11-30 MED ORDER — ESCITALOPRAM OXALATE 20 MG PO TABS: 20.0000 mg | ORAL_TABLET | Freq: Every day | ORAL | Status: DC

## 2013-11-30 NOTE — Progress Notes (Addendum)
   Subjective:    Patient ID: Bridget Watson, female    DOB: 11/14/1970, 43 y.o.   MRN: [id number]  HPI 43 y.o. female obese, GERD, DMII, HTN, hyperlipidemia presents for intermittent chest pain. [facility], while driving down she felt localized left sided chest "squeezing" with no radiation, with SOB, no other accompaniments, did have nausea the night before and increase beltching. Denies eating any food or any exertion prior to going to [facility]. [facility], had normal labs and normal EKG/CXR.    She states she had a similar episode 2-3 months ago on a treadmill with the pressure, like shake going through straw, and got off the machine and felt better. She has been having increasing fatigue for 2-3 months, we did recently decrease her wellbutrin a few months ago. The chest discomfort is intermittent, worse with stress, she is crying currently. Worse with deep breath in, family history of DVT in mother, no recent long travel, surgery.   Review of Systems  Constitutional: Positive for fatigue. Negative for fever, chills and unexpected weight change.  HENT: Negative.   Eyes: Negative.   Respiratory: Positive for shortness of breath. Negative for cough, choking, chest tightness and wheezing.   Cardiovascular: Positive for chest pain. Negative for palpitations and leg swelling.  Gastrointestinal: Negative.   Genitourinary: Negative.   Musculoskeletal: Negative.   Neurological: Negative.   Hematological: Negative.   Psychiatric/Behavioral: Negative for suicidal ideas.       Objective:   Physical Exam  Constitutional: She is oriented to person, place, and time. She appears well-nourished.  Obese  HENT:  Head: Normocephalic and atraumatic.  Right Ear: External ear normal.  Left Ear: External ear normal.  Eyes: Conjunctivae and EOM are normal. Pupils are equal, round, and reactive to light.  Neck: Normal range of motion. Neck supple.  No thyromegaly present.  Cardiovascular: Normal rate, regular rhythm, normal heart sounds and intact distal pulses.  Exam reveals no gallop and no friction rub.   No murmur heard. Pulmonary/Chest: Effort normal and breath sounds normal. She has no wheezes. She exhibits tenderness (Left 2-4 intercostals).  Questionable due to body habitus- decreased breath sounds diffusely  Abdominal: Soft. Bowel sounds are normal. There is no tenderness.  Obese  Musculoskeletal: Normal range of motion.  Lymphadenopathy:    She has no cervical adenopathy.  Neurological: She is alert and oriented to person, place, and time. She has normal reflexes. No cranial nerve deficit.  Skin: Skin is warm and dry.      Assessment & Plan:  Atypical chest pain with SOB, Fatigue  1) with upcoming Gastric bypass will refer to cardio, though low prob- thought that she was   referred before, will check paper chart.   2) SOB, obese, smoker, inactive, with family history DVT- will get Dimer and if + get CTA  3) Anxiety- get on wellbutrin 300XL, xanax refilled  4) GERD- PPI samples given, prilosec 40mg  daily  5) ?costochondrititis- Mobic 15mg  QD  OVER 40 minutes of exam, counseling, chart review, referral performed   All of labs are normal except Ddimer 0.69. With risks and symptoms willl order CTA PE protocol. Patient will go to the ER if she is has more CP, SOB.

## 2013-11-30 NOTE — Patient Instructions (Signed)
Bad carbs also include fruit juice, alcohol, and sweet tea. These are empty calories that do not signal to your brain that you are full.   Please remember the good carbs are still carbs which convert into sugar. So please measure them out no more than 1/2-1 cup of rice, oatmeal, pasta, and beans.  Veggies are however free foods! Pile them on.   I like lean protein at every meal such as chicken, [place]urkey, pork chops, cottage cheese, etc. Just do not fry these meats and please center your meal around vegetable, the meats should be a side dish.   No all fruit is created equal. Please see the list below, the fruit at the bottom is higher in sugars than the fruit at the top   Chest Pain (Nonspecific) It is often hard to give a specific diagnosis for the cause of chest pain. There is always a chance that your pain could be related to something serious, such as a heart attack or a blood clot in the lungs. You need to follow up with your caregiver for further evaluation. CAUSES   Heartburn.  Pneumonia or bronchitis.  Anxiety or stress.  Inflammation around your heart (pericarditis) or lung (pleuritis or pleurisy).  A blood clot in the lung.  A collapsed lung (pneumothorax). It can develop suddenly on its own (spontaneous pneumothorax) or from injury (trauma) to the chest.  Shingles infection (herpes zoster virus). The chest wall is composed of bones, muscles, and cartilage. Any of these can be the source of the pain.  The bones can be bruised by injury.  The muscles or cartilage can be strained by coughing or overwork.  The cartilage can be affected by inflammation and become sore (costochondritis). DIAGNOSIS  Lab tests or other studies, such as X-rays, electrocardiography, stress testing, or cardiac imaging, may be needed to find the cause of your pain.  TREATMENT   Treatment depends on what may be causing your chest pain. Treatment may include:  Acid blockers for  heartburn.  Anti-inflammatory medicine.  Pain medicine for inflammatory conditions.  Antibiotics if an infection is present.  You may be advised to change lifestyle habits. This includes stopping smoking and avoiding alcohol, caffeine, and chocolate.  You may be advised to keep your head raised (elevated) when sleeping. This reduces the chance of acid going backward from your stomach into your esophagus.  Most of the time, nonspecific chest pain will improve within 2 to 3 days with rest and mild pain medicine. HOME CARE INSTRUCTIONS   If antibiotics were prescribed, take your antibiotics as directed. Finish them even if you start to feel better.  For the next few days, avoid physical activities that bring on chest pain. Continue physical activities as directed.  Do not smoke.  Avoid drinking alcohol.  Only take over-the-counter or prescription medicine for pain, discomfort, or fever as directed by your caregiver.  Follow your caregiver's suggestions for further testing if your chest pain does not go away.  Keep any follow-up appointments you made. If you do not go to an appointment, you could develop lasting (chronic) problems with pain. If there is any problem keeping an appointment, you must call to reschedule. SEEK MEDICAL CARE IF:   You think you are having problems from the medicine you are taking. Read your medicine instructions carefully.  Your chest pain does not go away, even after treatment.  You develop a rash with blisters on your chest. SEEK IMMEDIATE MEDICAL CARE IF:   You  have increased chest pain or pain that spreads to your arm, neck, jaw, back, or abdomen.  You develop shortness of breath, an increasing cough, or you are coughing up blood.  You have severe back or abdominal pain, feel nauseous, or vomit.  You develop severe weakness, fainting, or chills.  You have a fever. THIS IS AN EMERGENCY. Do not wait to see if the pain will go away. Get medical  help at once. Call your local emergency services (911 in [place] Do not drive yourself to the hospital. MAKE SURE YOU:   Understand these instructions.  Will watch your condition.  Will get help right away if you are not doing well or get worse. Document Released: 07/15/2005 Document Revised: 12/28/2011 Document Reviewed: 05/10/2008 [facility] Patient Information 2014 [place], [place].

## 2013-12-01 ENCOUNTER — Encounter (HOSPITAL_COMMUNITY): Payer: Self-pay | Admitting: Emergency Medicine

## 2013-12-01 ENCOUNTER — Emergency Department (HOSPITAL_COMMUNITY)

## 2013-12-01 ENCOUNTER — Encounter: Payer: Self-pay | Admitting: Physician Assistant

## 2013-12-01 ENCOUNTER — Telehealth: Payer: Self-pay | Admitting: Physician Assistant

## 2013-12-01 DIAGNOSIS — F329 Major depressive disorder, single episode, unspecified: Secondary | ICD-10-CM | POA: Insufficient documentation

## 2013-12-01 DIAGNOSIS — F988 Other specified behavioral and emotional disorders with onset usually occurring in childhood and adolescence: Secondary | ICD-10-CM | POA: Insufficient documentation

## 2013-12-01 DIAGNOSIS — E785 Hyperlipidemia, unspecified: Secondary | ICD-10-CM | POA: Insufficient documentation

## 2013-12-01 DIAGNOSIS — J45901 Unspecified asthma with (acute) exacerbation: Secondary | ICD-10-CM | POA: Insufficient documentation

## 2013-12-01 DIAGNOSIS — Z881 Allergy status to other antibiotic agents status: Secondary | ICD-10-CM | POA: Insufficient documentation

## 2013-12-01 DIAGNOSIS — IMO0002 Reserved for concepts with insufficient information to code with codable children: Secondary | ICD-10-CM | POA: Insufficient documentation

## 2013-12-01 DIAGNOSIS — Z79899 Other long term (current) drug therapy: Secondary | ICD-10-CM | POA: Insufficient documentation

## 2013-12-01 DIAGNOSIS — E119 Type 2 diabetes mellitus without complications: Secondary | ICD-10-CM | POA: Insufficient documentation

## 2013-12-01 DIAGNOSIS — I10 Essential (primary) hypertension: Secondary | ICD-10-CM | POA: Insufficient documentation

## 2013-12-01 DIAGNOSIS — F411 Generalized anxiety disorder: Secondary | ICD-10-CM | POA: Insufficient documentation

## 2013-12-01 DIAGNOSIS — I209 Angina pectoris, unspecified: Principal | ICD-10-CM | POA: Insufficient documentation

## 2013-12-01 DIAGNOSIS — R110 Nausea: Secondary | ICD-10-CM | POA: Insufficient documentation

## 2013-12-01 DIAGNOSIS — E669 Obesity, unspecified: Secondary | ICD-10-CM | POA: Insufficient documentation

## 2013-12-01 DIAGNOSIS — F3289 Other specified depressive episodes: Secondary | ICD-10-CM | POA: Insufficient documentation

## 2013-12-01 DIAGNOSIS — Z853 Personal history of malignant neoplasm of breast: Secondary | ICD-10-CM | POA: Insufficient documentation

## 2013-12-01 DIAGNOSIS — K219 Gastro-esophageal reflux disease without esophagitis: Secondary | ICD-10-CM | POA: Insufficient documentation

## 2013-12-01 DIAGNOSIS — F17200 Nicotine dependence, unspecified, uncomplicated: Secondary | ICD-10-CM | POA: Insufficient documentation

## 2013-12-01 DIAGNOSIS — G609 Hereditary and idiopathic neuropathy, unspecified: Secondary | ICD-10-CM | POA: Insufficient documentation

## 2013-12-01 DIAGNOSIS — L74519 Primary focal hyperhidrosis, unspecified: Secondary | ICD-10-CM | POA: Insufficient documentation

## 2013-12-01 LAB — CBC
HEMATOCRIT: 34.3 % — AB (ref 36.0–46.0)
HEMOGLOBIN: 11.7 g/dL — AB (ref 12.0–15.0)
MCH: 27.5 pg (ref 26.0–34.0)
MCHC: 34.1 g/dL (ref 30.0–36.0)
MCV: 80.7 fL (ref 78.0–100.0)
PLATELETS: 275 10*3/uL (ref 150–400)
RBC: 4.25 MIL/uL (ref 3.87–5.11)
RDW: 13.4 % (ref 11.5–15.5)
WBC: 7.8 10*3/uL (ref 4.0–10.5)

## 2013-12-01 LAB — BASIC METABOLIC PANEL
BUN: 10 mg/dL (ref 6–23)
CALCIUM: 8.5 mg/dL (ref 8.4–10.5)
CHLORIDE: 103 meq/L (ref 96–112)
CO2: 22 meq/L (ref 19–32)
CREATININE: 0.88 mg/dL (ref 0.50–1.10)
GFR calc Af Amer: >90 mL/min (ref >90)
GFR calc non Af Amer: 81 mL/min — ABNORMAL LOW (ref >90)
GLUCOSE: 141 mg/dL — AB (ref 70–99)
Potassium: 3.6 mEq/L — ABNORMAL LOW (ref 3.7–5.3)
Sodium: 138 mEq/L (ref 137–147)

## 2013-12-01 LAB — POCT I-STAT TROPONIN I: Troponin i, poc: 0 ng/mL (ref 0.00–0.08)

## 2013-12-01 LAB — D-DIMER, QUANTITATIVE (NOT AT ARMC): D DIMER QUANT: 0.69 ug{FEU}/mL — AB (ref 0.00–0.48)

## 2013-12-01 LAB — PRO B NATRIURETIC PEPTIDE: Pro B Natriuretic peptide (BNP): 92.8 pg/mL (ref 0–125)

## 2013-12-01 LAB — POCT PREGNANCY, URINE: Preg Test, Ur: NEGATIVE

## 2013-12-01 NOTE — ED Notes (Addendum)
C/o L sided chest pressure that radiates to L arm x 3 days.  Also reports sob, nausea, and diaphoresis.  [facility] for same and at PCP office on Thursday.  Referred to cardiologist.   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  Pt called PCP office today after taking 1 NTG without relief and was advised to come to ED.  Elevated d-dimer from labs at PCP office yesterday.

## 2013-12-01 NOTE — Telephone Encounter (Signed)
Pt called today 12-01-2013= having a discomfort in her chest=she took 1 nitro 3 mins before calling and no change. Pt saw Bridget Watson yesterday for chest pains=spoke with Bridget at 11;40 and told her what the pt took me and she advise pt to go to the ER. Told pt and she said she would go.jsi

## 2013-12-01 NOTE — Telephone Encounter (Signed)
I agree, patient had an elevated Ddimer, if she continues to have chest pain, go to the ER for evaluation and possible CTA depending on their evaluation.

## 2013-12-01 NOTE — Addendum Note (Signed)
Addended by: Quentin MullingOLLIER, Shamra Bradeen R on: 12/01/2013 08:14 AM   Modules accepted: Orders

## 2013-12-02 ENCOUNTER — Emergency Department (HOSPITAL_COMMUNITY)

## 2013-12-02 ENCOUNTER — Observation Stay (HOSPITAL_COMMUNITY)
Admission: EM | Admit: 2013-12-02 | Discharge: 2013-12-02 | Disposition: A | Discharge status: Home / Self Care | Attending: Internal Medicine | Admitting: Internal Medicine

## 2013-12-02 ENCOUNTER — Observation Stay (HOSPITAL_COMMUNITY)

## 2013-12-02 ENCOUNTER — Other Ambulatory Visit (HOSPITAL_COMMUNITY): Payer: PRIVATE HEALTH INSURANCE

## 2013-12-02 ENCOUNTER — Encounter (HOSPITAL_COMMUNITY): Payer: Self-pay | Admitting: Radiology

## 2013-12-02 DIAGNOSIS — F3289 Other specified depressive episodes: Secondary | ICD-10-CM

## 2013-12-02 DIAGNOSIS — E669 Obesity, unspecified: Secondary | ICD-10-CM

## 2013-12-02 DIAGNOSIS — I10 Essential (primary) hypertension: Secondary | ICD-10-CM | POA: Diagnosis present

## 2013-12-02 DIAGNOSIS — Z6841 Body Mass Index (BMI) 40.0 and over, adult: Secondary | ICD-10-CM

## 2013-12-02 DIAGNOSIS — R072 Precordial pain: Secondary | ICD-10-CM

## 2013-12-02 DIAGNOSIS — E119 Type 2 diabetes mellitus without complications: Secondary | ICD-10-CM

## 2013-12-02 DIAGNOSIS — I209 Angina pectoris, unspecified: Secondary | ICD-10-CM

## 2013-12-02 DIAGNOSIS — R079 Chest pain, unspecified: Secondary | ICD-10-CM

## 2013-12-02 DIAGNOSIS — J45909 Unspecified asthma, uncomplicated: Secondary | ICD-10-CM

## 2013-12-02 DIAGNOSIS — E785 Hyperlipidemia, unspecified: Secondary | ICD-10-CM | POA: Diagnosis present

## 2013-12-02 DIAGNOSIS — K219 Gastro-esophageal reflux disease without esophagitis: Secondary | ICD-10-CM

## 2013-12-02 DIAGNOSIS — I208 Other forms of angina pectoris: Secondary | ICD-10-CM

## 2013-12-02 DIAGNOSIS — F329 Major depressive disorder, single episode, unspecified: Secondary | ICD-10-CM

## 2013-12-02 DIAGNOSIS — F32A Depression, unspecified: Secondary | ICD-10-CM

## 2013-12-02 HISTORY — DX: Anxiety disorder, unspecified: F41.9

## 2013-12-02 LAB — CBC
HEMATOCRIT: 35.1 % — AB (ref 36.0–46.0)
Hemoglobin: 12 g/dL (ref 12.0–15.0)
MCH: 27.9 pg (ref 26.0–34.0)
MCHC: 34.2 g/dL (ref 30.0–36.0)
MCV: 81.6 fL (ref 78.0–100.0)
Platelets: 272 10*3/uL (ref 150–400)
RBC: 4.3 MIL/uL (ref 3.87–5.11)
RDW: 13.6 % (ref 11.5–15.5)
WBC: 7.1 10*3/uL (ref 4.0–10.5)

## 2013-12-02 LAB — TROPONIN I: Troponin I: <0.3 ng/mL (ref <0.30)

## 2013-12-02 LAB — BASIC METABOLIC PANEL
BUN: 9 mg/dL (ref 6–23)
CHLORIDE: 105 meq/L (ref 96–112)
CO2: 24 mEq/L (ref 19–32)
CREATININE: 0.81 mg/dL (ref 0.50–1.10)
Calcium: 8.6 mg/dL (ref 8.4–10.5)
GFR calc Af Amer: >90 mL/min (ref >90)
GFR calc non Af Amer: 89 mL/min — ABNORMAL LOW (ref >90)
Glucose, Bld: 107 mg/dL — ABNORMAL HIGH (ref 70–99)
Potassium: 4.1 mEq/L (ref 3.7–5.3)
Sodium: 141 mEq/L (ref 137–147)

## 2013-12-02 LAB — HEMOGLOBIN A1C
Hgb A1c MFr Bld: 6.2 % — ABNORMAL HIGH (ref <5.7)
Mean Plasma Glucose: 131 mg/dL — ABNORMAL HIGH (ref <117)

## 2013-12-02 LAB — GLUCOSE, CAPILLARY: Glucose-Capillary: 97 mg/dL (ref 70–99)

## 2013-12-02 MED ORDER — SODIUM CHLORIDE 0.9 % IV SOLN: 250.0000 mL | INTRAVENOUS | Status: DC | PRN

## 2013-12-02 MED ORDER — SODIUM CHLORIDE 0.9 % IJ SOLN: 3.0000 mL | Freq: Two times a day (BID) | INTRAMUSCULAR | Status: DC

## 2013-12-02 MED ORDER — PANTOPRAZOLE SODIUM 40 MG PO TBEC
40.0000 mg | DELAYED_RELEASE_TABLET | Freq: Every day | ORAL | Status: DC
  Administered 2013-12-02: 40 mg via ORAL
  Filled 2013-12-02: qty 1

## 2013-12-02 MED ORDER — OXYCODONE HCL 5 MG PO TABS: 5.0000 mg | ORAL_TABLET | ORAL | Status: DC | PRN

## 2013-12-02 MED ORDER — MOMETASONE FURO-FORMOTEROL FUM 100-5 MCG/ACT IN AERO
2.0000 | INHALATION_SPRAY | Freq: Every day | RESPIRATORY_TRACT | Status: DC
  Administered 2013-12-02: 2 via RESPIRATORY_TRACT
  Filled 2013-12-02: qty 8.8

## 2013-12-02 MED ORDER — ENOXAPARIN SODIUM 40 MG/0.4ML ~~LOC~~ SOLN
40.0000 mg | SUBCUTANEOUS | Status: DC
  Filled 2013-12-02: qty 0.4

## 2013-12-02 MED ORDER — INSULIN ASPART 100 UNIT/ML ~~LOC~~ SOLN: 0.0000 [IU] | Freq: Three times a day (TID) | SUBCUTANEOUS | Status: DC

## 2013-12-02 MED ORDER — ONDANSETRON HCL 4 MG/2ML IJ SOLN: 4.0000 mg | Freq: Four times a day (QID) | INTRAMUSCULAR | Status: DC | PRN

## 2013-12-02 MED ORDER — FLUTICASONE PROPIONATE 50 MCG/ACT NA SUSP
1.0000 | Freq: Every day | NASAL | Status: DC
  Administered 2013-12-02: 1 via NASAL
  Filled 2013-12-02: qty 16

## 2013-12-02 MED ORDER — ALUM & MAG HYDROXIDE-SIMETH 200-200-20 MG/5ML PO SUSP: 30.0000 mL | Freq: Four times a day (QID) | ORAL | Status: DC | PRN

## 2013-12-02 MED ORDER — ALPRAZOLAM 0.5 MG PO TABS: 1.0000 mg | ORAL_TABLET | Freq: Three times a day (TID) | ORAL | Status: DC | PRN

## 2013-12-02 MED ORDER — IOHEXOL 350 MG/ML SOLN
100.0000 mL | Freq: Once | INTRAVENOUS | Status: AC | PRN
  Administered 2013-12-02: 100 mL via INTRAVENOUS

## 2013-12-02 MED ORDER — ACETAMINOPHEN 325 MG PO TABS
650.0000 mg | ORAL_TABLET | Freq: Four times a day (QID) | ORAL | Status: DC | PRN
Start: 2013-12-02 — End: 2013-12-02

## 2013-12-02 MED ORDER — ASPIRIN EC 325 MG PO TBEC
325.0000 mg | DELAYED_RELEASE_TABLET | Freq: Every day | ORAL | Status: DC
  Administered 2013-12-02: 325 mg via ORAL
  Filled 2013-12-02: qty 1

## 2013-12-02 MED ORDER — ASPIRIN 81 MG PO CHEW
324.0000 mg | CHEWABLE_TABLET | Freq: Once | ORAL | Status: AC
  Administered 2013-12-02: 324 mg via ORAL
  Filled 2013-12-02: qty 4

## 2013-12-02 MED ORDER — ACETAMINOPHEN 650 MG RE SUPP: 650.0000 mg | Freq: Four times a day (QID) | RECTAL | Status: DC | PRN

## 2013-12-02 MED ORDER — BUPROPION HCL ER (XL) 300 MG PO TB24
300.0000 mg | ORAL_TABLET | ORAL | Status: DC
  Administered 2013-12-02: 300 mg via ORAL
  Filled 2013-12-02 (×2): qty 1

## 2013-12-02 MED ORDER — INSULIN ASPART 100 UNIT/ML ~~LOC~~ SOLN: 0.0000 [IU] | Freq: Every day | SUBCUTANEOUS | Status: DC

## 2013-12-02 MED ORDER — ONDANSETRON HCL 4 MG PO TABS: 4.0000 mg | ORAL_TABLET | Freq: Four times a day (QID) | ORAL | Status: DC | PRN

## 2013-12-02 MED ORDER — HYDROMORPHONE HCL PF 1 MG/ML IJ SOLN: 0.5000 mg | INTRAMUSCULAR | Status: DC | PRN

## 2013-12-02 MED ORDER — NITROGLYCERIN 0.4 MG SL SUBL: 0.4000 mg | SUBLINGUAL_TABLET | SUBLINGUAL | Status: DC | PRN

## 2013-12-02 MED ORDER — SODIUM CHLORIDE 0.9 % IJ SOLN
3.0000 mL | INTRAMUSCULAR | Status: DC | PRN
Start: 2013-12-02 — End: 2013-12-02

## 2013-12-02 MED ORDER — ESCITALOPRAM OXALATE 20 MG PO TABS
20.0000 mg | ORAL_TABLET | Freq: Every day | ORAL | Status: DC
  Administered 2013-12-02: 20 mg via ORAL
  Filled 2013-12-02 (×2): qty 1

## 2013-12-02 MED ORDER — REGADENOSON 0.4 MG/5ML IV SOLN
0.4000 mg | Freq: Once | INTRAVENOUS | Status: DC
  Filled 2013-12-02: qty 5

## 2013-12-02 NOTE — H&P (Signed)
[facility] History and Physical  [name]:[id number] DOB: 02-Nov-1970 DOA: 12/02/2013  Referring physician:  PCP: Nadean Corwin, MD  Specialists:   Chief Complaint:   HPI: Bridget Watson is a 43 y.o. female with a history of HTN, Hyperlipidemia, Asthma and DM2 who presents to the Ed with complaints of Chest tightness and pain she describes as Discomfort mainly assocated with SOB.  He discomfort is worse with deep breaths.  She has been having intermittent chest discomfort x 2-3 days.  She was advised to go to the ED by her PCP to get an evaluation for a possible PE, a CTA of the Chest was performed and was negative for a PE and she was referred for a cardiac workup and possible stress test by the EDP.     Review of Systems:  Constitutional: No Weight Loss, No Weight Gain, Night Sweats, Fevers, Chills, Fatigue, or Generalized Weakness HEENT: No Headaches, Difficulty Swallowing,Tooth/Dental Problems,Sore Throat,  No Sneezing, Rhinitis, Ear Ache, Nasal Congestion, or Post Nasal Drip,  Cardio-vascular:  +Chest Pressure and Chest Pain, Orthopnea, PND, Edema in lower extremities, Anasarca, Dizziness, Palpitations  Resp: +Dyspnea, No DOE, No Productive Cough, No Non-Productive Cough, No Hemoptysis, No Change in Color of Mucus,  No Wheezing.    GI: No Heartburn, Indigestion, Abdominal Pain, Nausea, Vomiting, Diarrhea, Change in Bowel Habits,  Loss of Appetite  GU: No Dysuria, Change in Color of Urine, No Urgency or Frequency.  No flank pain.  Musculoskeletal: No Joint Pain or Swelling.  No Decreased Range of Motion. No Back Pain.  Neurologic: No Syncope, No Seizures, Muscle Weakness, Paresthesia, Vision Disturbance or Loss, No Diplopia, No Vertigo, No Difficulty Walking,  Skin: No Rash or Lesions. Psych: No Change in Mood or Affect. No Depression or Anxiety. No Memory loss. No Confusion or Hallucinations   Past Medical History  Diagnosis Date  . Hypertension   .  Hyperlipidemia   . Obesity   . Type II or unspecified type diabetes mellitus without mention of complication, not stated as uncontrolled   . Allergy   . GERD (gastroesophageal reflux disease)   . Depression   . ADD (attention deficit disorder)   . Asthma   . Peripheral neuropathy   . Anxiety       Past Surgical History  Procedure Laterality Date  . Oophorectomy Right 07/2009  . Wisdom tooth extraction         Prior to Admission medications   Medication Sig Start Date End Date Taking? Authorizing Provider  ALPRAZolam Prudy Feeler) 1 MG tablet Take 1 tablet (1 mg total) by mouth 3 (three) times daily as needed for sleep. 11/30/13 11/30/14 Yes Quentin Mulling, PA-C  azelastine (ASTELIN) 137 MCG/SPRAY nasal spray Place 1 spray into both nostrils daily. Use in each nostril as directed   Yes Historical Provider, MD  buPROPion (WELLBUTRIN XL) 300 MG 24 hr tablet Take 1 tablet (300 mg total) by mouth every morning. 11/30/13 11/30/14 Yes Quentin Mulling, PA-C  Cholecalciferol (VITAMIN D PO) Take 5,000 Units by mouth daily.   Yes Historical Provider, MD  escitalopram (LEXAPRO) 20 MG tablet Take 1 tablet (20 mg total) by mouth daily. 11/30/13 11/30/14 Yes Quentin Mulling, PA-C  meloxicam (MOBIC) 15 MG tablet Take 15 mg by mouth daily as needed for pain.   Yes Historical Provider, MD  metFORMIN (GLUCOPHAGE-XR) 500 MG 24 hr tablet Take 500 mg by mouth 2 (two) times daily.   Yes Historical Provider, MD  Methylphenidate HCl ER (QUILLIVANT XR)  25 MG/5ML SUSR Take 12 mg by mouth daily.    Yes Historical Provider, MD  mometasone (NASONEX) 50 MCG/ACT nasal spray Place 1 spray into both nostrils daily.   Yes Historical Provider, MD  mometasone-formoterol (DULERA) 100-5 MCG/ACT AERO Inhale 2 puffs into the lungs daily.   Yes Historical Provider, MD  nitroGLYCERIN (NITROSTAT) 0.4 MG SL tablet Place 1 tablet (0.4 mg total) under the tongue every 5 (five) minutes as needed for chest pain. 11/30/13  Yes Quentin Mulling,  PA-C  omeprazole (PRILOSEC) 20 MG capsule Take 20 mg by mouth daily.   Yes Historical Provider, MD  traZODone (DESYREL) 150 MG tablet Take 150 mg by mouth at bedtime.   Yes Historical Provider, MD      Allergies  Allergen Reactions  . Keflex [Cephalexin] Swelling     Social History:  reports that she has been smoking Cigarettes.  She has been smoking about 0.25 packs per day. She does not have any smokeless tobacco history on file. She reports that she drinks about 2.4 ounces of alcohol per week. She reports that she does not use illicit drugs.     Family History  Problem Relation Age of Onset  . Breast cancer Sister 48  . Breast cancer Sister     late 28s  . Prostate cancer Father   . Healthy Sister   . Stomach cancer Brother 22       Physical Exam:  GEN:  Pleasant  Morbidly Obese  43 y.o. African American  female  examined and in no acute distress; cooperative with exam Filed Vitals:   12/02/13 0300 12/02/13 0400 12/02/13 0428 12/02/13 0439  BP: 108/49 119/74  133/73  Pulse: 74 86  85  Temp:    98.8 F (37.1 C)  TempSrc:    Oral  Resp: 17 12  16   Height:   5\' 3"  (1.6 m)   Weight:    138.8 kg (306 lb)  SpO2: 100% 98%  99%   Blood pressure 133/73, pulse 85, temperature 98.8 F (37.1 C), temperature source Oral, resp. rate 16, height 5\' 3"  (1.6 m), weight 138.8 kg (306 lb), last menstrual period 11/30/2013, SpO2 99.00%. PSYCH: She is alert and oriented x4; does not appear anxious does not appear depressed; affect is normal HEENT: Normocephalic and Atraumatic, Mucous membranes pink; PERRLA; EOM intact; Fundi:  Benign;  No scleral icterus, Nares: Patent, Oropharynx: Clear,  Fair Dentition, Neck:  FROM, no cervical lymphadenopathy nor thyromegaly or carotid bruit; no JVD; Breasts:: Not examined CHEST WALL: No tenderness CHEST: Normal respiration, clear to auscultation bilaterally HEART: Regular rate and rhythm; no murmurs rubs or gallops BACK: No kyphosis or scoliosis;  no CVA tenderness ABDOMEN: Positive Bowel Sounds, Obese, soft non-tender; no masses, no organomegaly, no pannus; no intertriginous candida. Rectal Exam: Not done EXTREMITIES: No cyanosis, clubbing or edema; no ulcerations. Genitalia: not examined PULSES: 2+ and symmetric SKIN: Normal hydration no rash or ulceration CNS: A X O X 4,  Nonfocal  Vascular: pulses palpable throughout    Labs on Admission:  Basic Metabolic Panel:  Recent Labs Lab 11/30/13 1113 12/01/13 2146 12/02/13 0600  NA 142 138 141  K 4.1 3.6* 4.1  CL 109 103 105  CO2 28 22 24   GLUCOSE 87 141* 107*  BUN 12 10 9   CREATININE 0.88 0.88 0.81  CALCIUM 9.0 8.5 8.6   Liver Function Tests:  Recent Labs Lab 11/30/13 1113  AST 12  ALT 11  ALKPHOS 43  BILITOT 0.3  PROT 6.5  ALBUMIN 3.8   No results found for this basename: LIPASE, AMYLASE,  in the last 168 hours No results found for this basename: AMMONIA,  in the last 168 hours CBC:  Recent Labs Lab 11/30/13 1113 12/01/13 2146 12/02/13 0600  WBC 7.2 7.8 7.1  NEUTROABS 3.6  --   --   HGB 12.4 11.7* 12.0  HCT 37.5 34.3* 35.1*  MCV 80.8 80.7 81.6  PLT 312 275 272   Cardiac Enzymes:  Recent Labs Lab 12/02/13 0405  TROPONINI <0.30    BNP (last 3 results)  Recent Labs  12/01/13 2151  PROBNP 92.8   CBG: No results found for this basename: GLUCAP,  in the last 168 hours  Radiological Exams on Admission: Dg Chest 2 View  12/01/2013   CLINICAL DATA:  Chest pain, short of breath  EXAM: CHEST  2 VIEW  COMPARISON:  Prior chest x-ray 04/18/2010  FINDINGS: The lungs are clear and negative for focal airspace consolidation, pulmonary edema or suspicious pulmonary nodule. No pleural effusion or pneumothorax. Cardiac and mediastinal contours are within normal limits. No acute fracture or lytic or blastic osseous lesions. The visualized upper abdominal bowel gas pattern is unremarkable.  IMPRESSION: No active cardiopulmonary disease.   Electronically  Signed   By: Malachy MoanHeath  McCullough M.D.   On: 12/01/2013 23:07   Ct Angio Chest W/cm &/or Wo Cm  12/02/2013   CLINICAL DATA:  Intermittent chest pain,  elevated D-dimer  EXAM: CT ANGIOGRAPHY CHEST WITH CONTRAST  TECHNIQUE: Multidetector CT imaging of the chest was performed using the standard protocol during bolus administration of intravenous contrast. Multiplanar CT image reconstructions and MIPs were obtained to evaluate the vascular anatomy.  CONTRAST:  100mL OMNIPAQUE IOHEXOL 350 MG/ML SOLN  COMPARISON:  Prior chest x-ray 12/01/2013  FINDINGS: Mediastinum: Unremarkable appearance of the thyroid gland. No mediastinal or hilar adenopathy. A triangular strandy soft tissue in the mediastinum has an appearance most suggestive of residual thymic tissue. Unremarkable esophagus  Heart/Vascular: Adequate opacification of the pulmonary artery to the proximal subsegmental level. However, evaluation of the vessels beyond the segmental level is limited by streak artifact related to patient body habitus and respiratory motion. No central filling defect to suggest acute pulmonary embolus. The heart is enlarged and there is left ventricular dilatation. No pericardial effusion. No aortic dissection or aneurysmal dilatation.  Lungs/Pleura: Diffuse mild bronchial wall thickening. Linear scarring versus atelectasis in the left lower lobe. The lungs are otherwise clear.  Bones/Soft Tissues: No acute fracture or aggressive appearing lytic or blastic osseous lesion.  Upper Abdomen: Visualized upper abdominal organs are unremarkable.  Review of the MIP images confirms the above findings.  IMPRESSION: 1. Negative for acute pulmonary embolus, pneumonia or other acute cardiopulmonary process. 2. Cardiomegaly with left ventricular dilatation. 3. Diffuse mild bronchial wall thickening. Differential considerations include a bronchitis and inflammatory conditions such as asthma.   Electronically Signed   By: Malachy MoanHeath  McCullough M.D.   On:  12/02/2013 01:34      EKG: Independently reviewed.   Normal sinus Rhythm  NO acute S-T changes.       Assessment/Plan:   43 y.o. female with  Principal Problem:   Chest pain Active Problems:   Stable angina   Hypertension   Hyperlipidemia   Obesity   Type II or unspecified type diabetes mellitus without mention of complication, not stated as uncontrolled   GERD (gastroesophageal reflux disease)   Asthma   Tobacco Use  Morbid Obesity  1.    Chest Pain/Stable Angina-   Admitted for chest pain rule out and possible Stress testing. Monitor on Telemetry and Cycle Troponins, placed on Nitropaste, O2 and ASA Rx.     2.     HTN-  monitor BPs,    3.    Hyperlipidemia- check Fasting LIpids.     4.    DM2-  Hold Metformin  , SSI coverage PRN,  Check HbA1c.     5.    Asthma- stable,  Continue Dulera Rx.    6.     GERD-   continue PPI Rx.     7.     Morbid Obesity-  Preparing for Gastric Bypass Surgery.     8.     Tobacco Use Disorder-  She has decreased to 1-2 cigs/per day.     9.     DVT prophylaxis with Lovenox.       Code Status:   FULL CODE Family Communication:    No Family Present Disposition Plan:     Inpatient   Time spent:   78 Minutes  Ron Parker [facility] Pager [phone]  If 7PM-7AM, please contact night-coverage [email or web address] Password [facility] 12/02/2013, 7:31 AM

## 2013-12-02 NOTE — Discharge Summary (Signed)
Physician Discharge Summary  Bridget Watson NWG:[id number] DOB: [birth year] DOA: 12/02/2013  PCP: Nadean Corwin, MD  Admit date: 12/02/2013 Discharge date: 12/02/2013  Time spent: 45 minutes  Recommendations for Outpatient Follow-up:  1. Fu with Dr.Nahser in 7-10days 2. FU hbaic  Discharge Diagnoses:  Principal Problem:   Chest pain Active Problems:   Hypertension   Hyperlipidemia   Obesity   GERD (gastroesophageal reflux disease)   Asthma   Discharge Condition: stable  Diet recommendation:carb modified  Filed Weights   12/02/13 0439  Weight: 138.8 kg (306 lb)    History of present illness:  HPI: Bridget Watson is a 43 y.o. female with a history of HTN, Hyperlipidemia, Asthma and Borderline DM2 who presented to the Ed with complaints of Chest tightness and pain she describes as Discomfort mainly assocated with SOB. He discomfort is worse with deep breaths. She was having intermittent chest discomfort x 2-3 days. She was advised to go to the ED by her PCP to get an evaluation for a possible PE, a CTA of the Chest was performed and was negative for a PE   Hospital Course:  She had CTA chest negative for PE She was admitted and ruled out for MI based on EKG and cardiac enzymes. Was seen by Dr.Nahser with cardiology and was recommended to have a 2 day stress test however pt declined this due to school commitments and inability to stay for 2days Hence she underwent exercise stress test, no EKG changes to suggest ischemia, Dr.Nahser felt she was a low risk and hence ok to discharge and advised her to Follow up with him in clinic    Procedures:  Threadmill stress test  Consultations:  [place] [facility]  Discharge Exam: Filed Vitals:   12/02/13 1504  BP: 127/74  Pulse: 97  Temp: 98.1 F (36.7 C)  Resp: 16    General: AAOx3, no distress Cardiovascular: S1S2/RRR Respiratory: CTAB  Discharge Instructions  Discharge Orders   Future Appointments  Provider Department Dept Phone   12/13/2013 8:30 AM Wendall Stade, MD [facility] [place] [facility] [phone]   12/14/2013 8:45 AM Quentin Mulling, PA-C [place] [facility] [phone]   08/16/2014 2:00 PM Quentin Mulling, PA-C  [facility] [phone]   Future Orders Complete By Expires   Diet Carb Modified  As directed    Increase activity slowly  As directed        Medication List         ALPRAZolam 1 MG tablet  Commonly known as:  XANAX  Take 1 tablet (1 mg total) by mouth 3 (three) times daily as needed for sleep.     azelastine 137 MCG/SPRAY nasal spray  Commonly known as:  ASTELIN  Place 1 spray into both nostrils daily. Use in each nostril as directed     buPROPion 300 MG 24 hr tablet  Commonly known as:  WELLBUTRIN XL  Take 1 tablet (300 mg total) by mouth every morning.     escitalopram 20 MG tablet  Commonly known as:  LEXAPRO  Take 1 tablet (20 mg total) by mouth daily.     meloxicam 15 MG tablet  Commonly known as:  MOBIC  Take 15 mg by mouth daily as needed for pain.     metFORMIN 500 MG 24 hr tablet  Commonly known as:  GLUCOPHAGE-XR  Take 500 mg by mouth 2 (two) times daily.     mometasone 50 MCG/ACT nasal spray  Commonly known as:  NASONEX  Place 1 spray into both nostrils daily.     mometasone-formoterol 100-5 MCG/ACT Aero  Commonly known as:  DULERA  Inhale 2 puffs into the lungs daily.     nitroGLYCERIN 0.4 MG SL tablet  Commonly known as:  NITROSTAT  Place 1 tablet (0.4 mg total) under the tongue every 5 (five) minutes as needed for chest pain.     omeprazole 20 MG capsule  Commonly known as:  PRILOSEC  Take 20 mg by mouth daily.     QUILLIVANT XR 25 MG/5ML Susr  Generic drug:  Methylphenidate HCl ER  Take 12 mg by mouth daily.     traZODone 150 MG tablet  Commonly known as:  DESYREL  Take 150 mg by mouth at bedtime.     VITAMIN D PO  Take 5,000 Units by mouth daily.        Allergies  Allergen Reactions  . Keflex [Cephalexin] Swelling       Follow-up Information   Follow up with Elyn Aquas., MD. Schedule an appointment as soon as possible for a visit in 1 week.   Specialty:  Cardiology   Contact information:   [address] [place] [address] [phone]        The results of significant diagnostics from this hospitalization (including imaging, microbiology, ancillary and laboratory) are listed below for reference.    Significant Diagnostic Studies: Dg Chest 2 View  12/01/2013   CLINICAL DATA:  Chest pain, short of breath  EXAM: CHEST  2 VIEW  COMPARISON:  Prior chest x-ray 04/18/2010  FINDINGS: The lungs are clear and negative for focal airspace consolidation, pulmonary edema or suspicious pulmonary nodule. No pleural effusion or pneumothorax. Cardiac and mediastinal contours are within normal limits. No acute fracture or lytic or blastic osseous lesions. The visualized upper abdominal bowel gas pattern is unremarkable.  IMPRESSION: No active cardiopulmonary disease.   Electronically Signed   By: Malachy Moan M.D.   On: 12/01/2013 23:07   Ct Angio Chest W/cm &/or Wo Cm  12/02/2013   CLINICAL DATA:  Intermittent chest pain,  elevated D-dimer  EXAM: CT ANGIOGRAPHY CHEST WITH CONTRAST  TECHNIQUE: Multidetector CT imaging of the chest was performed using the standard protocol during bolus administration of intravenous contrast. Multiplanar CT image reconstructions and MIPs were obtained to evaluate the vascular anatomy.  CONTRAST:  OMNIPAQUE IOHEXOL 350 MG/ML SOLN  COMPARISON:  Prior chest x-ray 12/01/2013  FINDINGS: Mediastinum: Unremarkable appearance of the thyroid gland. No mediastinal or hilar adenopathy. A triangular strandy soft tissue in the mediastinum has an appearance most suggestive of residual thymic tissue. Unremarkable esophagus  Heart/Vascular: Adequate opacification of the pulmonary artery to the proximal  subsegmental level. However, evaluation of the vessels beyond the segmental level is limited by streak artifact related to patient body habitus and respiratory motion. No central filling defect to suggest acute pulmonary embolus. The heart is enlarged and there is left ventricular dilatation. No pericardial effusion. No aortic dissection or aneurysmal dilatation.  Lungs/Pleura: Diffuse mild bronchial wall thickening. Linear scarring versus atelectasis in the left lower lobe. The lungs are otherwise clear.  Bones/Soft Tissues: No acute fracture or aggressive appearing lytic or blastic osseous lesion.  Upper Abdomen: Visualized upper abdominal organs are unremarkable.  Review of the MIP images confirms the above findings.  IMPRESSION: 1. Negative for acute pulmonary embolus, pneumonia or other acute cardiopulmonary process. 2. Cardiomegaly with left ventricular dilatation. 3. Diffuse mild bronchial wall thickening. Differential  considerations include a bronchitis and inflammatory conditions such as asthma.   Electronically Signed   By: Malachy MoanHeath  McCullough M.D.   On: 12/02/2013 01:34    Microbiology: No results found for this or any previous visit (from the past 240 hour(s)).   Labs: Basic Metabolic Panel:  Recent Labs Lab 11/30/13 1113 12/01/13 2146 12/02/13 0600  NA 142 138 141  K 4.1 3.6* 4.1  CL 109 103 105  CO2 28 22 24   GLUCOSE 87 141* 107*  BUN 12 10 9   CREATININE 0.88 0.88 0.81  CALCIUM 9.0 8.5 8.6   Liver Function Tests:  Recent Labs Lab 11/30/13 1113  AST 12  ALT 11  ALKPHOS 43  BILITOT 0.3  PROT 6.5  ALBUMIN 3.8   No results found for this basename: LIPASE, AMYLASE,  in the last 168 hours No results found for this basename: AMMONIA,  in the last 168 hours CBC:  Recent Labs Lab 11/30/13 1113 12/01/13 2146 12/02/13 0600  WBC 7.2 7.8 7.1  NEUTROABS 3.6  --   --   HGB 12.4 11.7* 12.0  HCT 37.5 34.3* 35.1*  MCV 80.8 80.7 81.6  PLT 312 275 272   Cardiac  Enzymes:  Recent Labs Lab 12/02/13 0405 12/02/13 0940  TROPONINI <0.30 <0.30   BNP: BNP (last 3 results)  Recent Labs  12/01/13 2151  PROBNP 92.8   CBG:  Recent Labs Lab 12/02/13 0815  GLUCAP 97       Signed:  Manaal Mandala  [facility] 12/02/2013, 3:07 PM

## 2013-12-02 NOTE — ED Notes (Signed)
Assisted pt up to BR.  Ambulated without difficulty, denies pain.  MD spoke to pt regarding admission and need for stress test.  Pt is in agreement.

## 2013-12-02 NOTE — Progress Notes (Signed)
UR Completed.  Bridget Watson Jane [phone] 12/02/2013  

## 2013-12-02 NOTE — Progress Notes (Signed)
Nuclear stress test was changed to regular treadmill test, no nuclear images per Dr. Elease HashimotoNahser. Treadmill test done with B Simmons PA present.

## 2013-12-02 NOTE — Progress Notes (Addendum)
   Due to her size, we would not be able to do a 1 day stress study.  She is a IT consultantgrad student and [place]  She says that she cannot stay for a 2 day study.    We will do a regular stress test.  If she has good exercise capacity and has not ECG changes,she should be at low risk to be discharged.  She may follow up with us in the clinic.  She has an apt. To see Dr. Eden EmmsNishan this week.   Vesta MixerPhilip J. Lailany Enoch, Montez HagemanJr., MD, [facility] 12/02/2013, 1:16 PM Office - [phone] Pager [phone]   I have reviewed the treadmill. She walked for 6 min.  Achieved a peak HR of 164. No ST or T wave changes to suggest ischemia.  She should be at low risk for DC.  She may follow up with us in the office  She has an apt. With Dr. Eden EmmsNishan this week.  It may make more sense for her to see me at my next opening slot.  If she has more issues, she should call or go the nearest ER.   Vesta MixerPhilip J. Durenda Pechacek, Montez HagemanJr., MD, [facility] 12/02/2013, 2:59 PM Office - [phone] Pager [phone]

## 2013-12-02 NOTE — Progress Notes (Signed)
DM Type 2 showing up on pt records, however pt states she is NOT diabetic.  She is frustrated with staff asking her if she is diabetic and sticking finger for CBG's.  Will notify MD.  All glucose levels in blood work WNL.

## 2013-12-02 NOTE — ED Provider Notes (Signed)
CSN: [id number]     Arrival date & time 12/01/13  2134 History   First MD Initiated Contact with Patient 12/02/13 0056     Chief Complaint  Patient presents with  . Chest Pain     (Consider location/radiation/quality/duration/timing/severity/associated sxs/prior Treatment) HPI Comments: 43 y.o female, morbidly obese, with "borderline" DN - taking metformin, and smoking hx comes to the ED with cc of chest pain. For the past few days, patient has been having intermittent chest pain to the left side, with radiation to the arm. The pain is described as pressure like, tightness, and is exertional, usually resolves with rest. She saw her PCP, and has an outpatient appt with Cards set up already for next week. In the interval, her chest pains are getting more frequent and severe - so she comes to the ED as per the advise of her pcp. Pt has associated dib, nausea, and diaphoresis. She is currently chest pain free. She had a CT PE done prior to my evaluation - and the results show no PE.  Patient is a 43 y.o. female presenting with chest pain. The history is provided by the patient.  Chest Pain Associated symptoms: diaphoresis, nausea and shortness of breath   Associated symptoms: no abdominal pain, no headache and not vomiting     Past Medical History  Diagnosis Date  . Hypertension   . Hyperlipidemia   . Obesity   . Type II or unspecified type diabetes mellitus without mention of complication, not stated as uncontrolled   . Allergy   . GERD (gastroesophageal reflux disease)   . Depression   . ADD (attention deficit disorder)   . Asthma   . Peripheral neuropathy   . Anxiety    Past Surgical History  Procedure Laterality Date  . Oophorectomy Right 07/2009  . Wisdom tooth extraction     Family History  Problem Relation Age of Onset  . Breast cancer Sister 30  . Breast cancer Sister     late 25s  . Prostate cancer Father   . Healthy Sister   . Stomach cancer Brother 61   History   Substance Use Topics  . Smoking status: Current Some Day Smoker -- 0.25 packs/day    Types: Cigarettes  . Smokeless tobacco: Not on file  . Alcohol Use: 2.4 oz/week    4 Glasses of wine per week     Comment: 4-5 glasses of wine per week   OB History   Grav Para Term Preterm Abortions TAB SAB Ect Mult Living                 Review of Systems  Constitutional: Positive for diaphoresis. Negative for activity change.  Respiratory: Positive for shortness of breath.   Cardiovascular: Positive for chest pain.  Gastrointestinal: Positive for nausea. Negative for vomiting and abdominal pain.  Genitourinary: Negative for dysuria.  Musculoskeletal: Negative for neck pain.  Neurological: Negative for headaches.  All other systems reviewed and are negative.      Allergies  Keflex  Home Medications   Current Outpatient Rx  Name  Route  Sig  Dispense  Refill  . ALPRAZolam (XANAX) 1 MG tablet   Oral   Take 1 tablet (1 mg total) by mouth 3 (three) times daily as needed for sleep.   90 tablet   0   . azelastine (ASTELIN) 137 MCG/SPRAY nasal spray   Each Nare   Place 1 spray into both nostrils daily. Use in each nostril as directed         .  buPROPion (WELLBUTRIN XL) 300 MG 24 hr tablet   Oral   Take 1 tablet (300 mg total) by mouth every morning.   90 tablet   2   . Cholecalciferol (VITAMIN D PO)   Oral   Take 5,000 Units by mouth daily.         Marland Kitchen. escitalopram (LEXAPRO) 20 MG tablet   Oral   Take 1 tablet (20 mg total) by mouth daily.   90 tablet   1   . meloxicam (MOBIC) 15 MG tablet   Oral   Take 15 mg by mouth daily as needed for pain.         . metFORMIN (GLUCOPHAGE-XR) 500 MG 24 hr tablet   Oral   Take 500 mg by mouth 2 (two) times daily.         . Methylphenidate HCl ER (QUILLIVANT XR) 25 MG/5ML SUSR   Oral   Take 12 mg by mouth daily.          . mometasone (NASONEX) 50 MCG/ACT nasal spray   Each Nare   Place 1 spray into both nostrils daily.          . mometasone-formoterol (DULERA) 100-5 MCG/ACT AERO   Inhalation   Inhale 2 puffs into the lungs daily.         . nitroGLYCERIN (NITROSTAT) 0.4 MG SL tablet   Sublingual   Place 1 tablet (0.4 mg total) under the tongue every 5 (five) minutes as needed for chest pain.   25 tablet   1   . omeprazole (PRILOSEC) 20 MG capsule   Oral   Take 20 mg by mouth daily.         . traZODone (DESYREL) 150 MG tablet   Oral   Take 150 mg by mouth at bedtime.          BP 108/49  Pulse 74  Temp(Src) 98.9 F (37.2 C) (Oral)  Resp 17  SpO2 100%  LMP 11/30/2013 Physical Exam  Nursing note and vitals reviewed. Constitutional: She is oriented to person, place, and time. She appears well-developed and well-nourished.  HENT:  Head: Normocephalic and atraumatic.  Eyes: EOM are normal. Pupils are equal, round, and reactive to light.  Neck: Neck supple.  Cardiovascular: Normal rate, regular rhythm and normal heart sounds.   No murmur heard. Pulmonary/Chest: Effort normal. No respiratory distress.  Abdominal: Soft. She exhibits no distension. There is no tenderness. There is no rebound and no guarding.  Neurological: She is alert and oriented to person, place, and time.  Skin: Skin is warm and dry.    ED Course  Procedures (including critical care time) Labs Review Labs Reviewed  CBC - Abnormal; Notable for the following:    Hemoglobin 11.7 (*)    HCT 34.3 (*)    All other components within normal limits  BASIC METABOLIC PANEL - Abnormal; Notable for the following:    Potassium 3.6 (*)    Glucose, Bld 141 (*)    GFR calc non Af Amer 81 (*)    All other components within normal limits  PRO B NATRIURETIC PEPTIDE  TROPONIN I  TROPONIN I  TROPONIN I  POCT I-STAT TROPONIN I  POCT PREGNANCY, URINE   Imaging Review Dg Chest 2 View  12/01/2013   CLINICAL DATA:  Chest pain, short of breath  EXAM: CHEST  2 VIEW  COMPARISON:  Prior chest x-ray 04/18/2010  FINDINGS: The lungs are  clear and negative for focal airspace consolidation, pulmonary  edema or suspicious pulmonary nodule. No pleural effusion or pneumothorax. Cardiac and mediastinal contours are within normal limits. No acute fracture or lytic or blastic osseous lesions. The visualized upper abdominal bowel gas pattern is unremarkable.  IMPRESSION: No active cardiopulmonary disease.   Electronically Signed   By: Malachy Moan M.D.   On: 12/01/2013 23:07   Ct Angio Chest W/cm &/or Wo Cm  12/02/2013   CLINICAL DATA:  Intermittent chest pain,  elevated D-dimer  EXAM: CT ANGIOGRAPHY CHEST WITH CONTRAST  TECHNIQUE: Multidetector CT imaging of the chest was performed using the standard protocol during bolus administration of intravenous contrast. Multiplanar CT image reconstructions and MIPs were obtained to evaluate the vascular anatomy.  CONTRAST:  OMNIPAQUE IOHEXOL 350 MG/ML SOLN  COMPARISON:  Prior chest x-ray 12/01/2013  FINDINGS: Mediastinum: Unremarkable appearance of the thyroid gland. No mediastinal or hilar adenopathy. A triangular strandy soft tissue in the mediastinum has an appearance most suggestive of residual thymic tissue. Unremarkable esophagus  Heart/Vascular: Adequate opacification of the pulmonary artery to the proximal subsegmental level. However, evaluation of the vessels beyond the segmental level is limited by streak artifact related to patient body habitus and respiratory motion. No central filling defect to suggest acute pulmonary embolus. The heart is enlarged and there is left ventricular dilatation. No pericardial effusion. No aortic dissection or aneurysmal dilatation.  Lungs/Pleura: Diffuse mild bronchial wall thickening. Linear scarring versus atelectasis in the left lower lobe. The lungs are otherwise clear.  Bones/Soft Tissues: No acute fracture or aggressive appearing lytic or blastic osseous lesion.  Upper Abdomen: Visualized upper abdominal organs are unremarkable.  Review of the MIP images  confirms the above findings.  IMPRESSION: 1. Negative for acute pulmonary embolus, pneumonia or other acute cardiopulmonary process. 2. Cardiomegaly with left ventricular dilatation. 3. Diffuse mild bronchial wall thickening. Differential considerations include a bronchitis and inflammatory conditions such as asthma.   Electronically Signed   By: Malachy Moan M.D.   On: 12/02/2013 01:34    EKG Interpretation    Date/Time:  Friday December 01 2013 21:40:56 EST Ventricular Rate:  89 PR Interval:  160 QRS Duration: 98 QT Interval:  384 QTC Calculation: 467 R Axis:   48 Text Interpretation:  Normal sinus rhythm Incomplete right bundle branch block Borderline ECG Confirmed by Mayley Lish, MD, Kayia Billinger ([id number]) on 12/02/2013 2:26:35 AM            MDM   Final diagnoses:  Stable angina    Pt comes in with cc of chest pain. HEART SCORE is 3-4, depending on how many risk factors we quantify.  History  Highly suspicious 2   ECG  Normal 0   Age  ? 46 years 0   Risk Factors 2 ? 3 risk factors or history of atherosclerotic disease 2 (smoker, morbidly obese, borderline DM)   Troponin  ? normal limit 0    Pt's chest pain is typical, and is exertional and she has some concerning constitutionals. Given that her pain is getting more frequent and more severe, i think it will be best to admit this patient on a Friday, rather than discharge home, for possible inpatient stress test.  I will nit anticoagulate the patient in the ED, and we will treat this as a stable angina. ASA given.  Derwood Kaplan, MD 12/02/13 [phone]

## 2013-12-02 NOTE — Progress Notes (Signed)
Pt seen and examined Admitted this am with intermittent L sided chest pressure Cards following for stress test today Enzymes negative and EKG unremarkable CTA chest negative  Zannie CovePreetha Laresa Oshiro, MD [phone]

## 2013-12-02 NOTE — Consult Note (Signed)
Cardiology Consultation Note  Patient ID: Bridget Watson, MRN: [id number], DOB/AGE: [birth year] 43 y.o. Admit date: 12/02/2013   Date of Consult: 12/02/2013 Primary Physician: Nadean Corwin, MD Primary Cardiologist: nill   Chief Complaint: chest pain  Reason for Consult: chest pain   HPI: 43 yr old AAF with hx of morbid obesity, DM type II, HTN , smoking who presents ith chest pain   Pt has been having chest pain on and off for the past few weeks. It became constant in nature which is why she came into the ER. Describes this as pressure , left sided , nonradiating , discomfort worse with inspiration , exertion and anxiety and improving with rest. Pt is due for gastric bypass surgery and was being considered for cardiology referral for pre-op eval  by her internal medicine doctor.  Pt denies orthopnea, PND , LE edema , Syncope ,claudcation , focal weakness, or bleeding diathesis .  Has fatigue and SOB on exertion.    Past Medical History  Diagnosis Date  . Hypertension   . Hyperlipidemia   . Obesity   . Type II or unspecified type diabetes mellitus without mention of complication, not stated as uncontrolled   . Allergy   . GERD (gastroesophageal reflux disease)   . Depression   . ADD (attention deficit disorder)   . Asthma   . Peripheral neuropathy   . Anxiety       Most Recent Cardiac Studies:  EKG 02/14/02015 ; NSR    Surgical History:  Past Surgical History  Procedure Laterality Date  . Oophorectomy Right 07/2009  . Wisdom tooth extraction       Home Meds: Prior to Admission medications   Medication Sig Start Date End Date Taking? Authorizing Provider  ALPRAZolam Prudy Feeler) 1 MG tablet Take 1 tablet (1 mg total) by mouth 3 (three) times daily as needed for sleep. 11/30/13 11/30/14 Yes Quentin Mulling, PA-C  azelastine (ASTELIN) 137 MCG/SPRAY nasal spray Place 1 spray into both nostrils daily. Use in each nostril as directed   Yes Historical Provider, MD  buPROPion  (WELLBUTRIN XL) 300 MG 24 hr tablet Take 1 tablet (300 mg total) by mouth every morning. 11/30/13 11/30/14 Yes Quentin Mulling, PA-C  Cholecalciferol (VITAMIN D PO) Take 5,000 Units by mouth daily.   Yes Historical Provider, MD  escitalopram (LEXAPRO) 20 MG tablet Take 1 tablet (20 mg total) by mouth daily. 11/30/13 11/30/14 Yes Quentin Mulling, PA-C  meloxicam (MOBIC) 15 MG tablet Take 15 mg by mouth daily as needed for pain.   Yes Historical Provider, MD  metFORMIN (GLUCOPHAGE-XR) 500 MG 24 hr tablet Take 500 mg by mouth 2 (two) times daily.   Yes Historical Provider, MD  Methylphenidate HCl ER (QUILLIVANT XR) 25 MG/5ML SUSR Take 12 mg by mouth daily.    Yes Historical Provider, MD  mometasone (NASONEX) 50 MCG/ACT nasal spray Place 1 spray into both nostrils daily.   Yes Historical Provider, MD  mometasone-formoterol (DULERA) 100-5 MCG/ACT AERO Inhale 2 puffs into the lungs daily.   Yes Historical Provider, MD  nitroGLYCERIN (NITROSTAT) 0.4 MG SL tablet Place 1 tablet (0.4 mg total) under the tongue every 5 (five) minutes as needed for chest pain. 11/30/13  Yes Quentin Mulling, PA-C  omeprazole (PRILOSEC) 20 MG capsule Take 20 mg by mouth daily.   Yes Historical Provider, MD  traZODone (DESYREL) 150 MG tablet Take 150 mg by mouth at bedtime.   Yes Historical Provider, MD    Inpatient Medications:  .  aspirin EC  325 mg Oral Daily  . buPROPion  300 mg Oral [id number]  . enoxaparin (LOVENOX) injection  40 mg Subcutaneous Q24H  . escitalopram  20 mg Oral Daily  . fluticasone  1 spray Each Nare Daily  . insulin aspart  0-5 Units Subcutaneous QHS  . insulin aspart  0-9 Units Subcutaneous TID WC  . mometasone-formoterol  2 puff Inhalation Daily  . pantoprazole  40 mg Oral Daily  . sodium chloride  3 mL Intravenous Q12H  . sodium chloride  3 mL Intravenous Q12H      Allergies:  Allergies  Allergen Reactions  . Keflex [Cephalexin] Swelling    History   Social History  . Marital Status: Divorced     Spouse Name: N/A    Number of Children: N/A  . Years of Education: N/A   Occupational History  . Not on file.   Social History Main Topics  . Smoking status: Current Some Day Smoker -- 0.25 packs/day    Types: Cigarettes  . Smokeless tobacco: Not on file  . Alcohol Use: 2.4 oz/week    4 Glasses of wine per week     Comment: 4-5 glasses of wine per week  . Drug Use: No  . Sexual Activity: Not on file   Other Topics Concern  . Not on file   Social History Narrative  . No narrative on file     Family History  Problem Relation Age of Onset  . Breast cancer Sister 1240  . Breast cancer Sister     late 8030s  . Prostate cancer Father   . Healthy Sister   . Stomach cancer Brother 6947     Review of Systems: General: negative for chills, fever, night sweats or weight changes.  Cardiovascular: see HPI  Dermatological: negative for rash Respiratory: negative for cough or wheezing Urologic: negative for hematuria Abdominal: negative for nausea, vomiting, diarrhea, bright red blood per rectum, melena, or hematemesis Neurologic: negative for visual changes, syncope, or dizziness All other systems reviewed and are otherwise negative except as noted above.  Labs:  Recent Labs  12/02/13 0405  TROPONINI <0.30   Lab Results  Component Value Date   WBC 7.8 12/01/2013   HGB 11.7* 12/01/2013   HCT 34.3* 12/01/2013   MCV 80.7 12/01/2013   PLT 275 12/01/2013    Recent Labs Lab 11/30/13 1113 12/01/13 2146  NA 142 138  K 4.1 3.6*  CL 109 103  CO2 28 22  BUN 12 10  CREATININE 0.88 0.88  CALCIUM 9.0 8.5  PROT 6.5  --   BILITOT 0.3  --   ALKPHOS 43  --   ALT 11  --   AST 12  --   GLUCOSE 87 141*   No results found for this basename: CHOL, HDL, LDLCALC, TRIG   Lab Results  Component Value Date   DDIMER 0.69* 11/30/2013    Radiology/Studies:  Dg Chest 2 View  12/01/2013   CLINICAL DATA:  Chest pain, short of breath  EXAM: CHEST  2 VIEW  COMPARISON:  Prior chest x-ray  04/18/2010  FINDINGS: The lungs are clear and negative for focal airspace consolidation, pulmonary edema or suspicious pulmonary nodule. No pleural effusion or pneumothorax. Cardiac and mediastinal contours are within normal limits. No acute fracture or lytic or blastic osseous lesions. The visualized upper abdominal bowel gas pattern is unremarkable.  IMPRESSION: No active cardiopulmonary disease.   Electronically Signed   By: Isac CaddyHeath  McCullough M.D.  On: 12/01/2013 23:07   Ct Angio Chest W/cm &/or Wo Cm  12/02/2013   CLINICAL DATA:  Intermittent chest pain,  elevated D-dimer  EXAM: CT ANGIOGRAPHY CHEST WITH CONTRAST  TECHNIQUE: Multidetector CT imaging of the chest was performed using the standard protocol during bolus administration of intravenous contrast. Multiplanar CT image reconstructions and MIPs were obtained to evaluate the vascular anatomy.  CONTRAST:  OMNIPAQUE IOHEXOL 350 MG/ML SOLN  COMPARISON:  Prior chest x-ray 12/01/2013  FINDINGS: Mediastinum: Unremarkable appearance of the thyroid gland. No mediastinal or hilar adenopathy. A triangular strandy soft tissue in the mediastinum has an appearance most suggestive of residual thymic tissue. Unremarkable esophagus  Heart/Vascular: Adequate opacification of the pulmonary artery to the proximal subsegmental level. However, evaluation of the vessels beyond the segmental level is limited by streak artifact related to patient body habitus and respiratory motion. No central filling defect to suggest acute pulmonary embolus. The heart is enlarged and there is left ventricular dilatation. No pericardial effusion. No aortic dissection or aneurysmal dilatation.  Lungs/Pleura: Diffuse mild bronchial wall thickening. Linear scarring versus atelectasis in the left lower lobe. The lungs are otherwise clear.  Bones/Soft Tissues: No acute fracture or aggressive appearing lytic or blastic osseous lesion.  Upper Abdomen: Visualized upper abdominal organs are  unremarkable.  Review of the MIP images confirms the above findings.  IMPRESSION: 1. Negative for acute pulmonary embolus, pneumonia or other acute cardiopulmonary process. 2. Cardiomegaly with left ventricular dilatation. 3. Diffuse mild bronchial wall thickening. Differential considerations include a bronchitis and inflammatory conditions such as asthma.   Electronically Signed   By: Malachy Moan M.D.   On: 12/02/2013 01:34      Physical Exam:* Blood pressure 133/73, pulse 85, temperature 98.8 F (37.1 C), temperature source Oral, resp. rate 16, height 5\' 3"  (1.6 m), weight 138.8 kg (306 lb), last menstrual period 11/30/2013, SpO2 99.00%. General: obese body habitus,  in no acute distress. Head: Normocephalic, atraumatic, sclera non-icteric, no xanthomas, nares are without discharge.  Neck: Negative for carotid bruits. JVD not elevated. Lungs: Clear bilaterally to auscultation without wheezes, rales, or rhonchi. Breathing is unlabored. Heart: RRR with S1 S2. No murmurs, rubs, or gallops appreciated. Abdomen: Soft, non-tender, non-distended with normoactive bowel sounds. No hepatomegaly. No rebound/guarding. No obvious abdominal masses. Msk:  Strength and tone appear normal for age. Extremities: No clubbing or cyanosis. No edema. Radial pulses are 2+ and equal bilaterally. Psych:  Responds to questions appropriately with a normal affect.     Assessment and Plan:  precordial chest pain with atypical features  HTN  DM Type II  HLD  Plan  -Monitor on tele , trend CE  -Since she has several risk factors and is pre-op ,  will get stress test for ischemic eval and EF assessment once she is ruled out for ACS  - Keep NPO    Signed, Deborah Chalk, A M.D  12/02/2013, 5:14 AM

## 2013-12-05 ENCOUNTER — Ambulatory Visit: Payer: PRIVATE HEALTH INSURANCE | Admitting: Cardiovascular Disease

## 2013-12-13 ENCOUNTER — Ambulatory Visit: Payer: PRIVATE HEALTH INSURANCE | Admitting: Cardiovascular Disease

## 2013-12-13 ENCOUNTER — Telehealth: Payer: Self-pay | Admitting: Cardiovascular Disease

## 2013-12-13 NOTE — Telephone Encounter (Signed)
App made again due to canceling for inclement weather.

## 2013-12-13 NOTE — Telephone Encounter (Signed)
New problem   Pt need to speak to nurse concerning a soon appt b/c doc wanted to see her soon. Please call pt

## 2013-12-14 ENCOUNTER — Ambulatory Visit: Payer: PRIVATE HEALTH INSURANCE | Admitting: Cardiovascular Disease

## 2013-12-14 ENCOUNTER — Ambulatory Visit: Payer: Self-pay | Admitting: Physician Assistant

## 2013-12-15 ENCOUNTER — Ambulatory Visit (INDEPENDENT_AMBULATORY_CARE_PROVIDER_SITE_OTHER): Payer: PRIVATE HEALTH INSURANCE | Admitting: Cardiovascular Disease

## 2013-12-15 ENCOUNTER — Encounter: Payer: Self-pay | Admitting: Cardiovascular Disease

## 2013-12-15 VITALS — BP 111/71 | HR 81 | Ht 63.0 in | Wt 302.0 lb

## 2013-12-15 DIAGNOSIS — R079 Chest pain, unspecified: Secondary | ICD-10-CM

## 2013-12-15 NOTE — Assessment & Plan Note (Signed)
Bridget Watson  has not had any more episodes of chest pain. She had a normal stress test. At this point I do not think that she needs any further testing. She has not had any recurrent episodes of chest discomfort and I think that in my view would have a relatively high risk of having areas of attenuation in the study would be less than ideal.  Uncomfortable just observing her for now. I've encouraged her to continue to work on a good diet, exercise, and weight loss program.  She has [facility] to [facility] to be considered for  gastric  Bypass.  They may need to further evaluation

## 2013-12-15 NOTE — Progress Notes (Signed)
Yetta NumbersLeslie D Shumard Date of Birth  November 19, 1970       [facility]    [place] [facility] [address], [address]  [address] [place], [place]  [id number]   [place]  [id number] [phone]    [phone]   Fax  [phone]     Fax [phone]  Problem List: 1. Chest discomfort 2. Hypertension 3. Hyperlipidemia 4. Borderline DM   History of Present Illness:  Verlon AuLeslie is a [age] year old female was admitted to the hospital recently with some episodes of chest pain. She ruled out for myocardial infarction.   D-dimer was minimally evated - a followup CT angiogram of the lungs revealed no evidence of pulmonary embolus.  She was scheduled for stress testing she walked for a total of 6 minutes and achieved her target heart rate. She has no ST or T wave changes.  She has continued to have some mild chest pressures but not nearly as bad as before.    She has a degree in Investment banker, corporatepolitical science, she has a degree in Optician, dispensingublic administration and she is planning on transferring to [employer] law school this year.    She ha not been exercising as much as she should - has a rotator cuff injury.    Was not able to get an MRI because of her size.   [facility].      Current Outpatient Prescriptions on File Prior to Visit  Medication Sig Dispense Refill  . ALPRAZolam (XANAX) 1 MG tablet Take 1 tablet (1 mg total) by mouth 3 (three) times daily as needed for sleep.  90 tablet  0  . azelastine (ASTELIN) 137 MCG/SPRAY nasal spray Place 1 spray into both nostrils daily. Use in each nostril as directed      . buPROPion (WELLBUTRIN XL) 300 MG 24 hr tablet Take 1 tablet (300 mg total) by mouth every morning.  90 tablet  2  . Cholecalciferol (VITAMIN D PO) Take 5,000 Units by mouth daily.      Marland Kitchen. escitalopram (LEXAPRO) 20 MG tablet Take 1 tablet (20 mg total) by mouth daily.  90 tablet  1  . meloxicam (MOBIC) 15 MG tablet Take 15 mg by mouth daily as needed  for pain.      . metFORMIN (GLUCOPHAGE-XR) 500 MG 24 hr tablet Take 500 mg by mouth 2 (two) times daily.      . Methylphenidate HCl ER (QUILLIVANT XR) 25 MG/5ML SUSR Take 12 mg by mouth daily.       . mometasone (NASONEX) 50 MCG/ACT nasal spray Place 1 spray into both nostrils daily.      . mometasone-formoterol (DULERA) 100-5 MCG/ACT AERO Inhale 2 puffs into the lungs daily.      . nitroGLYCERIN (NITROSTAT) 0.4 MG SL tablet Place 1 tablet (0.4 mg total) under the tongue every 5 (five) minutes as needed for chest pain.  25 tablet  1  . omeprazole (PRILOSEC) 20 MG capsule Take 20 mg by mouth daily.      . traZODone (DESYREL) 150 MG tablet Take 150 mg by mouth at bedtime.       No current facility-administered medications on file prior to visit.    Allergies  Allergen Reactions  . Keflex [Cephalexin] Swelling    Past Medical History  Diagnosis Date  . Hypertension   . Hyperlipidemia   . Obesity   . Type II or unspecified type diabetes mellitus without  mention of complication, not stated as uncontrolled   . Allergy   . GERD (gastroesophageal reflux disease)   . Depression   . ADD (attention deficit disorder)   . Asthma   . Peripheral neuropathy   . Anxiety     Past Surgical History  Procedure Laterality Date  . Oophorectomy Right 07/2009  . Wisdom tooth extraction      History  Smoking status  . Current Some Day Smoker -- 0.25 packs/day  . Types: Cigarettes  Smokeless tobacco  . Not on file    History  Alcohol Use  . 2.4 oz/week  . 4 Glasses of wine per week    Comment: 4-5 glasses of wine per week    Family History  Problem Relation Age of Onset  . Breast cancer Sister 75  . Breast cancer Sister     late 98s  . Prostate cancer Father   . Healthy Sister   . Stomach cancer Brother 72    Reviw of Systems:  Reviewed in the HPI.  All other systems are negative.  Physical Exam: Height 5\' 3"  (1.6 m), weight 302 lb (136.986 kg), last menstrual period  11/30/2013. Wt Readings from Last 3 Encounters:  12/15/13 302 lb (136.986 kg)  12/02/13 306 lb (138.8 kg)  11/30/13 296 lb (134.265 kg)     General: Well developed, well nourished, in no acute distress.  Head: Normocephalic, atraumatic, sclera non-icteric, mucus membranes are moist,   Neck: Supple. Carotids are 2 + without bruits. No JVD   Lungs: Clear   Heart: RR, normal S1S2  Abdomen: Soft, non-tender, non-distended with normal bowel sounds.  Msk:  Strength and tone are normal   Extremities: No clubbing or cyanosis. No edema.  Distal pedal pulses are 2+ and equal    Neuro: CN II - XII intact.  Alert and oriented X 3.   Psych:  Normal   ECG:   Assessment / Plan:

## 2013-12-15 NOTE — Patient Instructions (Signed)
Your physician recommends that you schedule a follow-up appointment in: AS NEEDED BASIS  Your physician recommends that you continue on your current medications as directed. Please refer to the Current Medication list given to you today.    

## 2013-12-20 ENCOUNTER — Ambulatory Visit: Payer: Self-pay | Admitting: Physician Assistant

## 2014-02-15 ENCOUNTER — Encounter: Payer: Self-pay | Admitting: Physician Assistant

## 2014-02-15 ENCOUNTER — Ambulatory Visit (INDEPENDENT_AMBULATORY_CARE_PROVIDER_SITE_OTHER): Admitting: Physician Assistant

## 2014-02-15 VITALS — BP 128/68 | HR 84 | Temp 100.0°F | Resp 16 | Ht 63.0 in | Wt 303.0 lb

## 2014-02-15 DIAGNOSIS — M26609 Unspecified temporomandibular joint disorder, unspecified side: Secondary | ICD-10-CM

## 2014-02-15 DIAGNOSIS — F411 Generalized anxiety disorder: Secondary | ICD-10-CM

## 2014-02-15 MED ORDER — CYCLOBENZAPRINE HCL 10 MG PO TABS: 10.0000 mg | ORAL_TABLET | Freq: Three times a day (TID) | ORAL | Status: DC | PRN

## 2014-02-15 NOTE — Patient Instructions (Signed)
What is the TMJ? The temporomandibular (tem-PUH-ro-man-DIB-yoo-ler) joint, or the TMJ, connects the upper and lower jawbones. This joint allows the jaw to open wide and move back and forth when you chew, talk, or yawn.There are also several muscles that help this joint move. There can be muscle tightness and pain in the muscle that can cause several symptoms.  What causes TMJ pain? There are many causes of TMJ pain. Repeated chewing (for example, chewing gum) and clenching your teeth can cause pain in the joint. Some TMJ pain has no obvious cause. What can I do to ease the pain? There are many things you can do to help your pain get better. When you have pain:  Eat soft foods and stay away from chewy foods (for example, taffy) Try to use both sides of your mouth to chew Don't chew gum Don't open your mouth wide (for example, during yawning or singing) Don't bite your cheeks or fingernails Lower your amount of stress and worry Applying a warm, damp washcloth to the joint may help. Over-the-counter pain medicines such as ibuprofen (one brand: Advil) or acetaminophen (one brand: Tylenol) might also help. Do not use these medicines if you are allergic to them or if your doctor told you not to use them. How can I stop the pain from coming back? When your pain is better, you can do these exercises to make your muscles stronger and to keep the pain from coming back:  Resisted mouth opening: Place your thumb or two fingers under your chin and open your mouth slowly, pushing up lightly on your chin with your thumb. Hold for three to six seconds. Close your mouth slowly. Resisted mouth closing: Place your thumbs under your chin and your two index fingers on the ridge between your mouth and the bottom of your chin. Push down lightly on your chin as you close your mouth. Tongue up: Slowly open and close your mouth while keeping the tongue touching the roof of the mouth. Side-to-side jaw movement: Place an  object about one fourth of an inch thick (for example, two tongue depressors) between your front teeth. Slowly move your jaw from side to side. Increase the thickness of the object as the exercise becomes easier Forward jaw movement: Place an object about one fourth of an inch thick between your front teeth and move the bottom jaw forward so that the bottom teeth are in front of the top teeth. Increase the thickness of the object as the exercise becomes easier. These exercises should not be painful. If it hurts to do these exercises, stop doing them and talk to your family doctor.    

## 2014-02-15 NOTE — Progress Notes (Signed)
   Subjective:    Patient ID: Bridget Watson, female    DOB: Oct 06, 1971, 43 y.o.   MRN: [id number]  Otalgia  There is pain in the right (with radiation down her neck) ear. This is a new problem. Episode onset: 2-3 weeks. Episode frequency: intermittent. The problem has been gradually worsening. The maximum temperature recorded prior to her arrival was 100 - 100.9 F. The pain is moderate. Associated symptoms include drainage, headaches, neck pain and rhinorrhea. Pertinent negatives include no abdominal pain, coughing, diarrhea, ear discharge, hearing loss, rash, sore throat or vomiting.   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 In addition she has an E[facility] coming [facility] and then she will be able to schedule for the 20th of next month.    Review of Systems  Constitutional: Negative for chills and diaphoresis.  HENT: Positive for congestion, ear pain, postnasal drip, rhinorrhea, sinus pressure and sneezing. Negative for ear discharge, hearing loss and sore throat.   Respiratory: Negative for cough, chest tightness, shortness of breath and wheezing.   Cardiovascular: Negative.   Gastrointestinal: Negative.  Negative for vomiting, abdominal pain and diarrhea.  Genitourinary: Negative.   Musculoskeletal: Positive for neck pain.  Skin: Negative for rash.  Neurological: Positive for headaches.       Objective:   Physical Exam  Constitutional: She is oriented to person, place, and time. She appears well-developed and well-nourished.  HENT:  Head: Normocephalic and atraumatic.  Right Ear: External ear normal.  Left Ear: External ear normal.  Mouth/Throat: Oropharynx is clear and moist.  + TMJ tenderness  Eyes: Conjunctivae and EOM are normal. Pupils are equal, round, and reactive to light.  Neck: Normal range of motion. Neck supple. No thyromegaly present.  Cardiovascular: Normal rate, regular rhythm and normal heart sounds.  Exam reveals no  gallop and no friction rub.   No murmur heard. Pulmonary/Chest: Effort normal and breath sounds normal. No respiratory distress. She has no wheezes.  Abdominal: Soft. Bowel sounds are normal. She exhibits no distension and no mass. There is no tenderness. There is no rebound and no guarding.  Musculoskeletal: Normal range of motion.  Lymphadenopathy:    She has no cervical adenopathy.  Neurological: She is alert and oriented to person, place, and time. She displays normal reflexes. No cranial nerve deficit. Coordination normal.  Skin: Skin is warm and dry.  Psychiatric: She has a normal mood and affect.      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 Flexeril 10 #60 NR

## 2014-04-12 ENCOUNTER — Other Ambulatory Visit: Payer: Self-pay | Admitting: Physician Assistant

## 2014-04-12 MED ORDER — ALPRAZOLAM 1 MG PO TABS: 1.0000 mg | ORAL_TABLET | Freq: Three times a day (TID) | ORAL | Status: DC | PRN

## 2014-04-18 HISTORY — PX: GASTRIC BYPASS: SHX52

## 2014-05-25 ENCOUNTER — Ambulatory Visit (INDEPENDENT_AMBULATORY_CARE_PROVIDER_SITE_OTHER): Admitting: Internal Medicine

## 2014-05-25 ENCOUNTER — Encounter: Payer: Self-pay | Admitting: Internal Medicine

## 2014-05-25 VITALS — BP 124/76 | HR 88 | Temp 98.4°F | Resp 16 | Ht 63.5 in | Wt 282.4 lb

## 2014-05-25 DIAGNOSIS — J0100 Acute maxillary sinusitis, unspecified: Secondary | ICD-10-CM

## 2014-05-25 DIAGNOSIS — J0410 Acute tracheitis without obstruction: Secondary | ICD-10-CM

## 2014-05-25 MED ORDER — PREDNISONE 20 MG PO TABS: ORAL_TABLET | ORAL | Status: DC

## 2014-05-25 MED ORDER — AZITHROMYCIN 250 MG PO TABS: ORAL_TABLET | ORAL | Status: DC

## 2014-05-25 MED ORDER — HYDROCODONE-ACETAMINOPHEN 5-325 MG PO TABS: ORAL_TABLET | ORAL | Status: AC

## 2014-05-25 NOTE — Progress Notes (Signed)
Subjective:    Patient ID: Bridget Watson, female    DOB: Jul 13, 1971, 43 y.o.   MRN: [id number]  HPI Patient present with c/o non- productive cough and right ear pain x 3 days.    Medication Sig  . ALPRAZolam (XANAX) 1 MG tablet Take 1 tablet (1 mg total) by mouth 3 (three) times daily as needed for sleep.  Marland Kitchen azelastine (ASTELIN) 137 MCG/SPRAY nasal spray Place 1 spray into both nostrils daily. Use in each nostril as directed  . buPROPion (WELLBUTRIN XL) 300 MG 24 hr tablet Take 1 tablet (300 mg total) by mouth every morning.  . Cholecalciferol (VITAMIN D PO) Take 5,000 Units by mouth daily.  . cyclobenzaprine (FLEXERIL) 10 MG tablet Take 1 tablet (10 mg total) by mouth every 8 (eight) hours as needed for muscle spasms.  Marland Kitchen escitalopram (LEXAPRO) 20 MG tablet Take 1 tablet (20 mg total) by mouth daily.  . meloxicam (MOBIC) 15 MG tablet Take 15 mg by mouth daily as needed for pain.  . Methylphenidate HCl ER (QUILLIVANT XR) 25 MG/5ML SUSR Take 12 mg by mouth daily.   . mometasone (NASONEX) 50 MCG/ACT nasal spray Place 1 spray into both nostrils daily.  . mometasone-formoterol (DULERA) 100-5 MCG/ACT AERO Inhale 2 puffs into the lungs daily.  . nitroGLYCERIN (NITROSTAT) 0.4 MG SL tablet Place 1 tablet (0.4 mg total) under the tongue every 5 (five) minutes as needed for chest pain.  . traZODone (DESYREL) 150 MG tablet Take 150 mg by mouth at bedtime.   Allergies  Allergen Reactions  . Keflex [Cephalexin] Swelling   Past Medical History  Diagnosis Date  . Hypertension   . Hyperlipidemia   . Obesity   . Type II or unspecified type diabetes mellitus without mention of complication, not stated as uncontrolled   . Allergy   . GERD (gastroesophageal reflux disease)   . Depression   . ADD (attention deficit disorder)   . Asthma   . Peripheral neuropathy   . Anxiety    Review of Systems  In addition to the HPI above,  No Fever-chills,  No Headache, No changes with Vision or hearing,   No problems swallowing food or Liquids,  No Chest pain or productive Cough or Shortness of Breath,  No Abdominal pain, No Nausea or Vomitting, Bowel movements are regular,  No new skin rashes or bruises,  No new joints pains-aches,  No new weakness, tingling, numbness in any extremity,  Has recent weight loss from Bariatric Surgery at [facility], No polyuria, polydypsia or polyphagia,  No significant Mental Stressors.  A full 10 point Review of Systems was done, except as stated above, all other Review of Systems were negative  Objective:   Physical Exam  BP 124/76  Pulse 88  Temp(Src) 98.4 F (36.9 C) (Temporal)  Resp 16  Ht 5' 3.5" (1.613 m)  Wt 282 lb 6.4 oz (128.096 kg)  BMI 49.23 kg/m2  HEENT - Eac's patent. TM's Nl. (+)tender rt>Lt TM  Jts.  EOM's full. PERRLA. NasoOroPharynx clear. Neck - supple. Nl Thyroid. Carotids 2+ & No bruits, nodes, JVD Chest - Clear equal BS w/few scattered rales & no  rhonchi, wheezes. Cor - Nl HS. RRR w/o sig MGR. PP 1(+). No edema. Abd - No palpable organomegaly, masses or tenderness. BS nl. MS- FROM w/o deformities. Muscle power, tone and bulk Nl. Gait Nl. Neuro - No obvious Cr N abnormalities. Sensory, motor and Cerebellar functions appear Nl w/o focal abnormalities. Psyche - Mental  status normal & appropriate.  No delusions, ideations or obvious mood abnormalities.  Assessment & Plan:   1. Acute maxillary sinusitis  2. Acute tracheitis  Rx- ZPak,Prednisone taper

## 2014-05-25 NOTE — Patient Instructions (Signed)

## 2014-08-03 IMAGING — CT CT ANGIO CHEST
2 of 9 series · 17 of 36 positions shown · IV contrast (omnipaque)
Comparison: Prior chest x-ray 12/01/2013

CLINICAL DATA: Intermittent chest pain,  elevated D-dimer

EXAM:
CT ANGIOGRAPHY CHEST WITH CONTRAST
TECHNIQUE: Multidetector CT imaging of the chest was performed using the
standard protocol during bolus administration of intravenous
contrast. Multiplanar CT image reconstructions and MIPs were
obtained to evaluate the vascular anatomy.
CONTRAST:  100mL OMNIPAQUE IOHEXOL 350 MG/ML SOLN

[Series 7: pe thins · axial · 0.68mm/px · z∈[-13,+220]mm · 16 of 265 slices shown]
[im 16/265  lung]
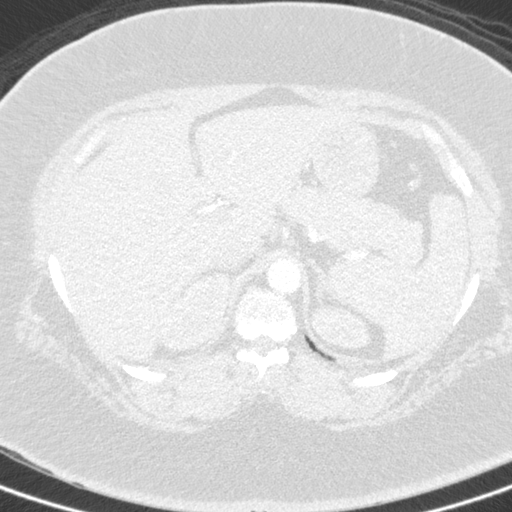
[im 32/265  mediastinal]
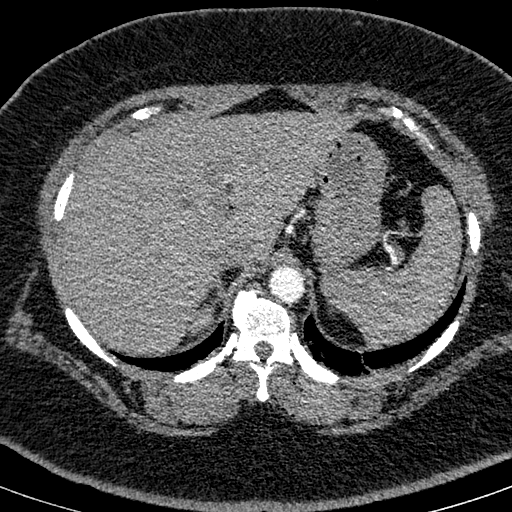
[im 47/265  lung]
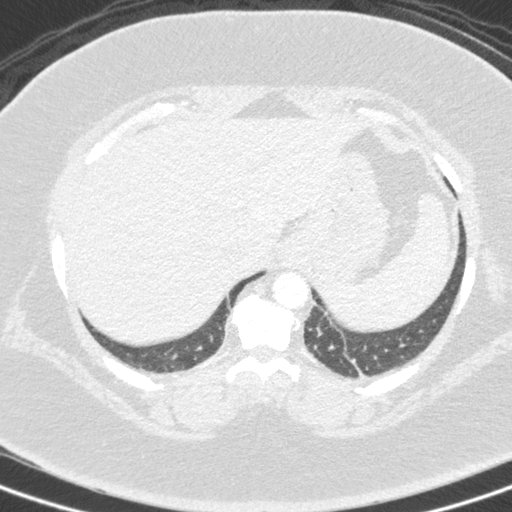
[im 63/265  mediastinal]
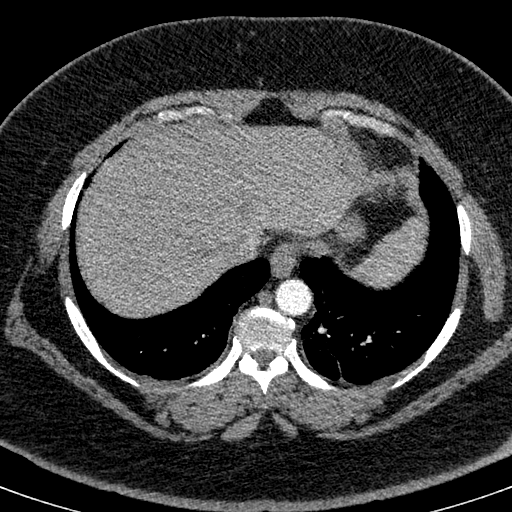
[im 78/265  lung]
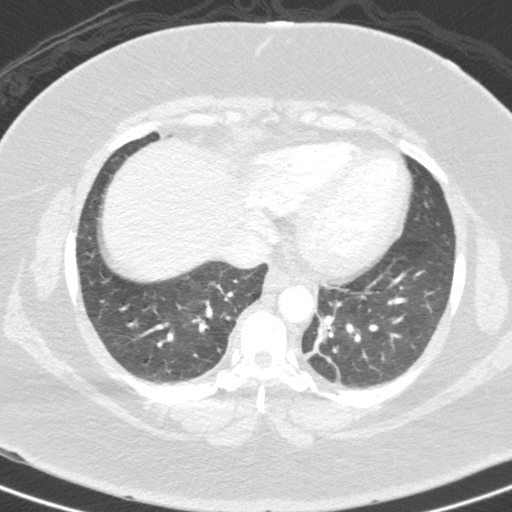
[im 94/265  mediastinal]
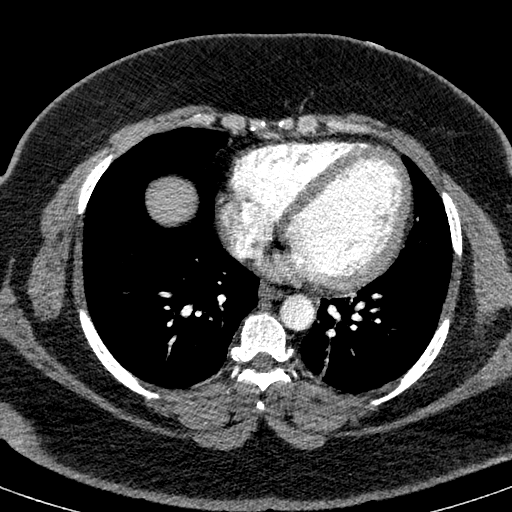
[im 109/265  lung]
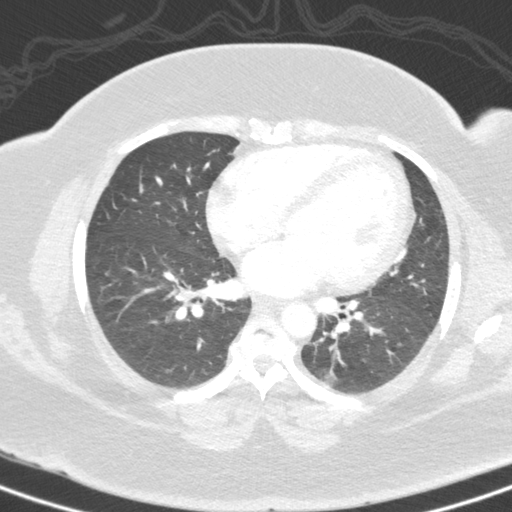
[im 125/265  mediastinal]
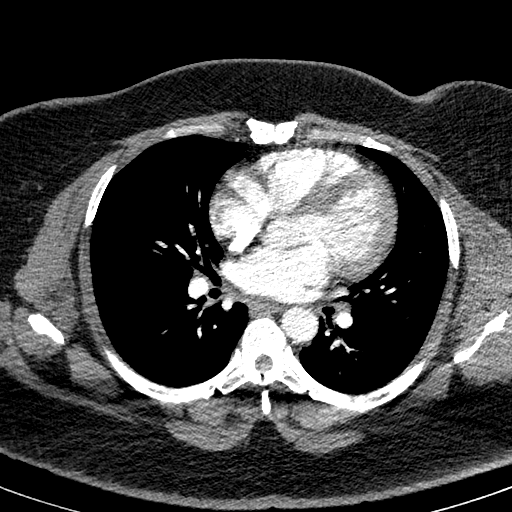
[im 140/265  lung]
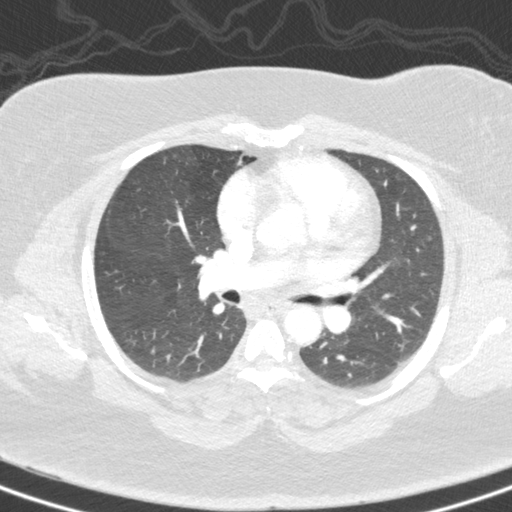
[im 156/265  mediastinal]
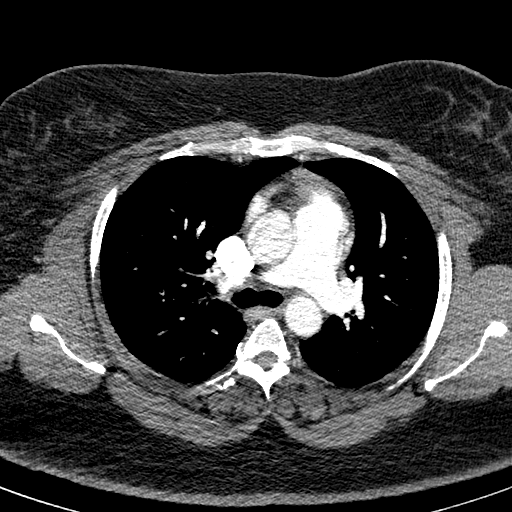
[im 171/265  lung]
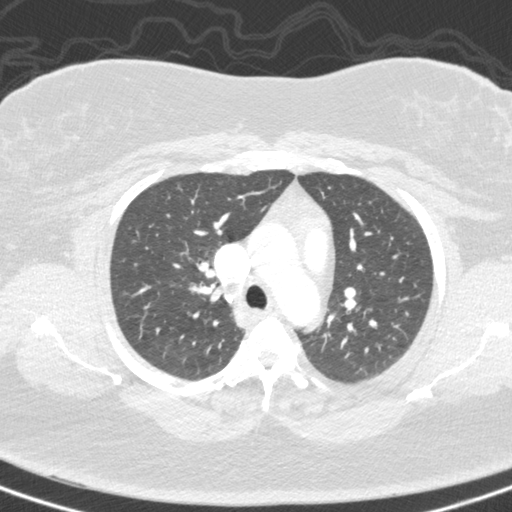
[im 187/265  mediastinal]
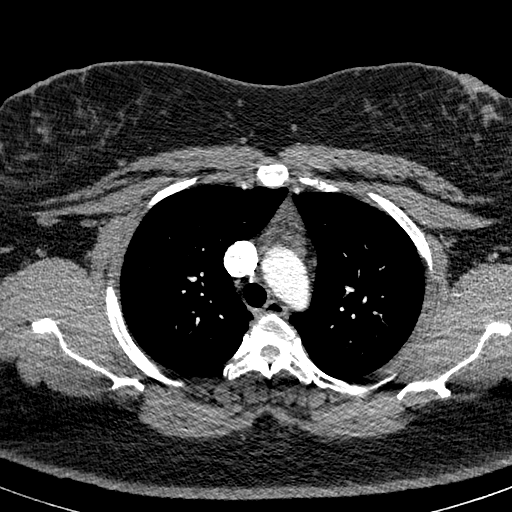
[im 202/265  lung]
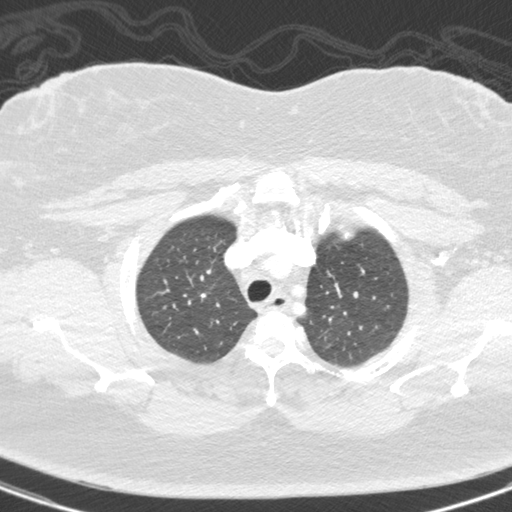
[im 218/265  mediastinal]
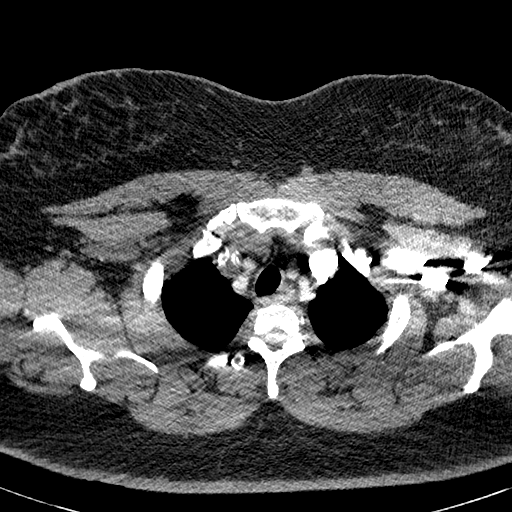
[im 233/265  lung]
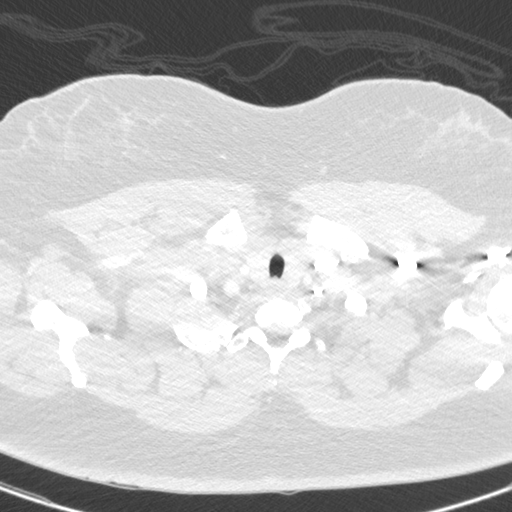
[im 249/265  mediastinal]
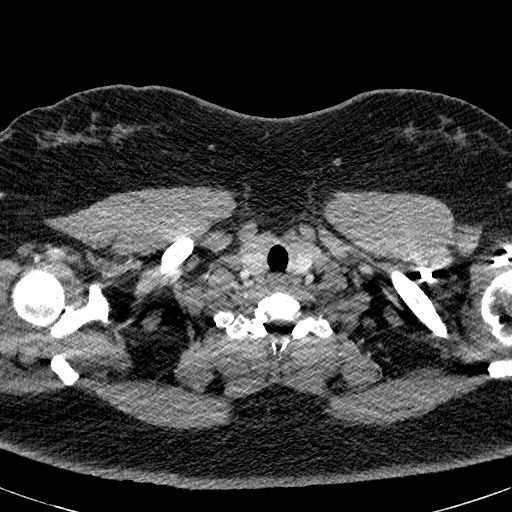

[mpr, coronals, coronal · coronal · 0.68mm/px · 1 of 122 slices shown]
[im 61/122  mediastinal]
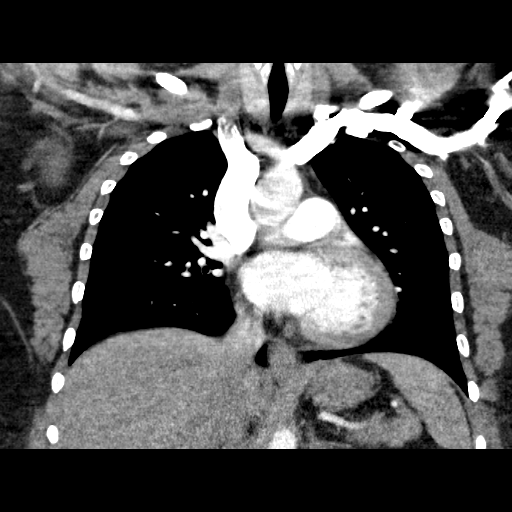

[17 of 36 positions shown; findings below may reference images not displayed]

FINDINGS: Mediastinum: Unremarkable appearance of the thyroid gland. No
mediastinal or hilar adenopathy. A triangular strandy soft tissue in
the mediastinum has an appearance most suggestive of residual thymic
tissue. Unremarkable esophagus

Heart/Vascular: Adequate opacification of the pulmonary artery to
the proximal subsegmental level. However, evaluation of the vessels
beyond the segmental level is limited by streak artifact related to
patient body habitus and respiratory motion. No central filling
defect to suggest acute pulmonary embolus. The heart is enlarged and
there is left ventricular dilatation. No pericardial effusion. No
aortic dissection or aneurysmal dilatation.

Lungs/Pleura: Diffuse mild bronchial wall thickening. Linear
scarring versus atelectasis in the left lower lobe. The lungs are
otherwise clear.

Bones/Soft Tissues: No acute fracture or aggressive appearing lytic
or blastic osseous lesion.

Upper Abdomen: Visualized upper abdominal organs are unremarkable.

Review of the MIP images confirms the above findings.
IMPRESSION: 1. Negative for acute pulmonary embolus, pneumonia or other acute
cardiopulmonary process.
2. Cardiomegaly with left ventricular dilatation.
3. Diffuse mild bronchial wall thickening. Differential
considerations include a bronchitis and inflammatory conditions such
as asthma.

## 2014-08-16 ENCOUNTER — Encounter: Payer: Self-pay | Admitting: Physician Assistant

## 2014-09-18 ENCOUNTER — Other Ambulatory Visit: Payer: Self-pay | Admitting: Physician Assistant

## 2014-09-19 ENCOUNTER — Encounter: Payer: Self-pay | Admitting: *Deleted

## 2014-10-03 ENCOUNTER — Encounter: Payer: Self-pay | Admitting: Physician Assistant

## 2014-10-03 ENCOUNTER — Ambulatory Visit (INDEPENDENT_AMBULATORY_CARE_PROVIDER_SITE_OTHER): Admitting: Physician Assistant

## 2014-10-03 VITALS — BP 108/62 | HR 92 | Temp 98.2°F | Resp 18 | Ht 62.5 in | Wt 240.0 lb

## 2014-10-03 DIAGNOSIS — G5602 Carpal tunnel syndrome, left upper limb: Secondary | ICD-10-CM

## 2014-10-03 MED ORDER — PREDNISONE 20 MG PO TABS: ORAL_TABLET | ORAL | Status: AC

## 2014-10-03 NOTE — Patient Instructions (Addendum)
-  Take Prednisone as prescribed to help with inflammation.  Will do referral to [facility].      Carpal Tunnel Release Carpal tunnel release is done to relieve the pressure on the nerves and tendons on the bottom side of your wrist.  LET YOUR CAREGIVER KNOW ABOUT:   Allergies to food or medicine.  Medicines taken, including vitamins, herbs, eyedrops, over-the-counter medicines, and creams.  Use of steroids (by mouth or creams).  Previous problems with anesthetics or numbing medicines.  History of bleeding problems or blood clots.  Previous surgery.  Other health problems, including diabetes and kidney problems.  Possibility of pregnancy, if this applies. RISKS AND COMPLICATIONS  Some problems that may happen after this procedure include:  Infection.  Damage to the nerves, arteries or tendons could occur. This would be very uncommon.  Bleeding. BEFORE THE PROCEDURE   This surgery may be done while you are asleep (general anesthetic) or may be done under a block where only your forearm and the surgical area is numb.  If the surgery is done under a block, the numbness will gradually wear off within several hours after surgery. HOME CARE INSTRUCTIONS   Have a responsible person with you for 24 hours.  Do not drive a car or use public transportation for 24 hours.  Only take over-the-counter or prescription medicines for pain, discomfort, or fever as directed by your caregiver. Take them as directed.  You may put ice on the palm side of the affected wrist.  Put ice in a plastic bag.  Place a towel between your skin and the bag.  Leave the ice on for 20 to 30 minutes, 4 times per day.  If you were given a splint to keep your wrist from bending, use it as directed. It is important to wear the splint at night or as directed. Use the splint for as long as you have pain or numbness in your hand, arm, or wrist. This may take 1 to 2 months.  Keep your hand raised  (elevated) above the level of your heart as much as possible. This keeps swelling down and helps with discomfort.  Change bandages (dressings) as directed.  Keep the wound clean and dry. SEEK MEDICAL CARE IF:   You develop pain not relieved with medications.  You develop numbness of your hand.  You develop bleeding from your surgical site.  You have an oral temperature above 102 F (38.9 C).  You develop redness or swelling of the surgical site.  You develop new, unexplained problems. SEEK IMMEDIATE MEDICAL CARE IF:   You develop a rash.  You have difficulty breathing.  You develop any reaction or side effects to medications given. Document Released: 12/26/2003 Document Revised: 12/28/2011 Document Reviewed: 08/11/2007 [facility] Patient Information 2015 [employer] [place]. This information is not intended to replace advice given to you by your health care provider. Make sure you discuss any questions you have with your health care provider.

## 2014-10-03 NOTE — Progress Notes (Signed)
Subjective:    Patient ID: [name], female    DOB: 11-27-1970, 43 y.o.   MRN: [id number]  Wrist Pain  The pain is present in the left wrist (Radiates to left elbow sometimes). This is a new problem. Episode onset: 3-4 months ago. There has been no history of extremity trauma. The problem occurs intermittently. The problem has been waxing and waning. The quality of the pain is described as burning (Stabbing). The pain is at a severity of 2/10 (Later in evening 10/10). Pertinent negatives include no fever. The symptoms are aggravated by activity. Treatments tried: Tylenol.  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 The treatment provided no relief.  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 GFR= >90 on 12/02/13 Review of Systems  Constitutional: Positive for fatigue. Negative for fever, chills and diaphoresis.  HENT: Negative for congestion, ear discharge, ear pain, rhinorrhea, sinus pressure, sore throat and trouble swallowing.   Eyes: Negative.   Respiratory: Negative.   Cardiovascular: Negative.   Gastrointestinal: Negative.   Genitourinary: Negative.   Skin: Negative.  Negative for rash.  Allergic/Immunologic: Negative for environmental allergies.  Neurological: Positive for headaches. Negative for dizziness and light-headedness.  Psychiatric/Behavioral: Negative.        Anxiety and Stress   Past Medical History  Diagnosis Date  . Hypertension   . Hyperlipidemia   . Obesity   . Type II or unspecified type diabetes mellitus without mention of complication, not stated as uncontrolled   . Allergy   . GERD (gastroesophageal reflux disease)   . Depression   . ADD (attention deficit disorder)   . Asthma   . Peripheral neuropathy   . Anxiety    Current Outpatient Prescriptions on File Prior to Visit  Medication Sig Dispense Refill  . ALPRAZolam (XANAX) 1 MG tablet take 1 tablet by mouth three times a day if needed 90 tablet 0  . azelastine  (ASTELIN) 137 MCG/SPRAY nasal spray Place 1 spray into both nostrils daily. Use in each nostril as directed    . buPROPion (WELLBUTRIN XL) 300 MG 24 hr tablet Take 1 tablet (300 mg total) by mouth every morning. 90 tablet 2  . Cholecalciferol (VITAMIN D PO) Take 5,000 Units by mouth daily.    Marland Kitchen. escitalopram (LEXAPRO) 20 MG tablet TAKE ONE TABLET BY MOUTH ONCE DAILY 90 tablet 0  . Methylphenidate HCl ER (QUILLIVANT XR) 25 MG/5ML SUSR Take 12 mg by mouth daily.     . mometasone (NASONEX) 50 MCG/ACT nasal spray Place 1 spray into both nostrils daily.    . mometasone-formoterol (DULERA) 100-5 MCG/ACT AERO Inhale 2 puffs into the lungs daily.    . nitroGLYCERIN (NITROSTAT) 0.4 MG SL tablet Place 1 tablet (0.4 mg total) under the tongue every 5 (five) minutes as needed for chest pain. 25 tablet 1  . traZODone (DESYREL) 150 MG tablet TAKE ONE TABLET BY MOUTH ONCE DAILY AT BEDTIME 90 tablet 0   No current facility-administered medications on file prior to visit.   Allergies  Allergen Reactions  . Keflex [Cephalexin] Swelling     BP 108/62 mmHg  Pulse 92  Temp(Src) 98.2 F (36.8 C) (Temporal)  Resp 18  Ht 5' 2.5" (1.588 m)  Wt 240 lb (108.863 kg)  BMI 43.17 kg/m2  SpO2 98%  LMP 10/03/2014 Wt Readings from Last 3 Encounters:  10/03/14 240 lb (108.863 kg)  05/25/14 282 lb 6.4 oz (128.096 kg)  02/15/14 303 lb (137.44 kg)   Objective:  Physical Exam  Constitutional: She is oriented to person, place, and time. She appears well-developed and well-nourished. She does not have a sickly appearance. No distress.  HENT:  Head: Normocephalic.  Eyes: Conjunctivae and lids are normal. Right eye exhibits no discharge. Left eye exhibits no discharge. No scleral icterus.  Neck: Phonation normal.  Cardiovascular: Normal rate, regular rhythm, S1 normal, S2 normal, normal heart sounds, intact distal pulses and normal pulses.  Exam reveals no gallop, no distant heart sounds and no friction rub.   No murmur  heard. Pulmonary/Chest: Effort normal and breath sounds normal. No respiratory distress. She has no decreased breath sounds. She has no wheezes. She has no rhonchi. She has no rales. She exhibits no tenderness.  Musculoskeletal:       Right wrist: Normal.       Left wrist: She exhibits decreased range of motion and tenderness. She exhibits no bony tenderness, no swelling, no effusion, no crepitus, no deformity and no laceration.  Positive Tinel's sign in left wrist only. Positive Finkelstein's test in left wrist only.  Neurological: She is alert and oriented to person, place, and time. She has normal reflexes. No sensory deficit. Gait normal.  Strength is 5/5, except for left wrist being 4/5.  Skin: Skin is warm, dry and intact. No rash noted. She is not diaphoretic. No cyanosis. No pallor. Nails show no clubbing.  Psychiatric: She has a normal mood and affect. Her speech is normal and behavior is normal. Judgment and thought content normal. Cognition and memory are normal.  Vitals reviewed.  Assessment & Plan:  1. Carpal tunnel syndrome of left wrist or possible DeQuervain's Tenosynovitis  -Take Prednisone as prescribed for inflammation- predniSONE (DELTASONE) 20 MG tablet; Take 3 tablets PO QDaily for 3 days, then take 2 tablets PO QDaily for 3 days, then take 1 tablet PO QDaily for 3 days  Dispense: 18 tablet; Refill: 0 - [facility] you with date and time of appointment. -Can get wrist brace at medical supply store.  Discussed medication effects and SE's.  Pt agreed to treatment plan. Please keep your physical appt on 12/17/14.  Kairee Isa, Lise AuerJennifer L, PA-C 9:44 PM [facility]

## 2014-12-17 ENCOUNTER — Encounter: Payer: Self-pay | Admitting: Physician Assistant

## 2014-12-21 ENCOUNTER — Other Ambulatory Visit: Payer: Self-pay | Admitting: Physician Assistant

## 2014-12-21 ENCOUNTER — Encounter: Payer: Self-pay | Admitting: Physician Assistant

## 2014-12-21 MED ORDER — AZITHROMYCIN 250 MG PO TABS: ORAL_TABLET | ORAL | Status: DC

## 2014-12-21 MED ORDER — PREDNISONE 20 MG PO TABS
ORAL_TABLET | ORAL | Status: DC
Start: 2014-12-21 — End: 2015-01-07

## 2015-01-02 ENCOUNTER — Encounter: Payer: Self-pay | Admitting: Physician Assistant

## 2015-01-06 ENCOUNTER — Encounter: Payer: Self-pay | Admitting: *Deleted

## 2015-01-07 ENCOUNTER — Encounter: Payer: Self-pay | Admitting: Physician Assistant

## 2015-01-07 ENCOUNTER — Ambulatory Visit (INDEPENDENT_AMBULATORY_CARE_PROVIDER_SITE_OTHER): Admitting: Physician Assistant

## 2015-01-07 VITALS — BP 110/70 | HR 88 | Temp 97.9°F | Resp 16 | Ht 63.5 in | Wt 231.0 lb

## 2015-01-07 DIAGNOSIS — R7303 Prediabetes: Secondary | ICD-10-CM | POA: Insufficient documentation

## 2015-01-07 DIAGNOSIS — F988 Other specified behavioral and emotional disorders with onset usually occurring in childhood and adolescence: Secondary | ICD-10-CM

## 2015-01-07 DIAGNOSIS — G4700 Insomnia, unspecified: Secondary | ICD-10-CM

## 2015-01-07 DIAGNOSIS — T7840XD Allergy, unspecified, subsequent encounter: Secondary | ICD-10-CM

## 2015-01-07 DIAGNOSIS — R7309 Other abnormal glucose: Secondary | ICD-10-CM

## 2015-01-07 DIAGNOSIS — F32A Depression, unspecified: Secondary | ICD-10-CM

## 2015-01-07 DIAGNOSIS — F329 Major depressive disorder, single episode, unspecified: Secondary | ICD-10-CM

## 2015-01-07 DIAGNOSIS — I10 Essential (primary) hypertension: Secondary | ICD-10-CM

## 2015-01-07 DIAGNOSIS — E876 Hypokalemia: Secondary | ICD-10-CM

## 2015-01-07 DIAGNOSIS — K2100 Gastro-esophageal reflux disease with esophagitis, without bleeding: Secondary | ICD-10-CM

## 2015-01-07 DIAGNOSIS — E669 Obesity, unspecified: Secondary | ICD-10-CM

## 2015-01-07 DIAGNOSIS — E785 Hyperlipidemia, unspecified: Secondary | ICD-10-CM

## 2015-01-07 DIAGNOSIS — J45909 Unspecified asthma, uncomplicated: Secondary | ICD-10-CM

## 2015-01-07 DIAGNOSIS — Z803 Family history of malignant neoplasm of breast: Secondary | ICD-10-CM | POA: Insufficient documentation

## 2015-01-07 DIAGNOSIS — F909 Attention-deficit hyperactivity disorder, unspecified type: Secondary | ICD-10-CM

## 2015-01-07 DIAGNOSIS — Z9884 Bariatric surgery status: Secondary | ICD-10-CM

## 2015-01-07 DIAGNOSIS — G629 Polyneuropathy, unspecified: Secondary | ICD-10-CM

## 2015-01-07 DIAGNOSIS — Z0001 Encounter for general adult medical examination with abnormal findings: Secondary | ICD-10-CM

## 2015-01-07 LAB — BASIC METABOLIC PANEL WITH GFR
BUN: 11 mg/dL (ref 6–23)
CO2: 28 meq/L (ref 19–32)
Calcium: 9.3 mg/dL (ref 8.4–10.5)
Chloride: 103 mEq/L (ref 96–112)
Creat: 0.84 mg/dL (ref 0.50–1.10)
GFR, Est African American: >89 mL/min
GFR, Est Non African American: 86 mL/min
Glucose, Bld: 79 mg/dL (ref 70–99)
POTASSIUM: 4 meq/L (ref 3.5–5.3)
SODIUM: 140 meq/L (ref 135–145)

## 2015-01-07 LAB — MAGNESIUM: Magnesium: 1.9 mg/dL (ref 1.5–2.5)

## 2015-01-07 MED ORDER — ESCITALOPRAM OXALATE 20 MG PO TABS: 20.0000 mg | ORAL_TABLET | Freq: Every day | ORAL | Status: DC

## 2015-01-07 MED ORDER — BUPROPION HCL ER (XL) 300 MG PO TB24: 300.0000 mg | ORAL_TABLET | ORAL | Status: DC

## 2015-01-07 MED ORDER — ALPRAZOLAM 1 MG PO TABS: ORAL_TABLET | ORAL | Status: DC

## 2015-01-07 MED ORDER — TRAZODONE HCL 150 MG PO TABS: 150.0000 mg | ORAL_TABLET | Freq: Every day | ORAL | Status: DC

## 2015-01-07 NOTE — Patient Instructions (Addendum)
Encourage you to get the 3D Mammogram  The 3D Mammogram is much more specific and sensitive to pick up breast cancer. For women with fibrocystic breast or lumpy breast it can be hard to determine if it is cancer or not but the 3D mammogram is able to tell this difference which cuts back on unneeded additional tests or scary call backs.   [facility]  7 a.m.-6:30 p.m., Monday 7 a.m.-5 p.m., Tuesday-Friday Schedule an appointment by calling [phone].  [facility] Schedule an appointment by calling [phone].   Potassium Content of Foods  The body needs potassium to control blood pressure and to keep the muscles and nervous system healthy. Here are some healthy foods below that are high in potassium. Also you can get the white label salt of "NO SALT" salt substitute, 1/4 teaspoon of this is equivalent to potassium.   FOODS AND DRINKS HIGH IN POTASSIUM FOODS MODERATE IN POTASSIUM   Fruits  Avocado (cubed),  c / 50 g.  Banana (sliced), 75 g.  Cantaloupe (cubed), 80 g.  Honeydew, 1 wedge / 85 g.  Kiwi (sliced), 90 g.  Nectarine, 1 small / 129 g.  Orange, 1 medium / 131 g. Vegetables  Artichoke,  of a medium / 64 g.  Asparagus (boiled), 90 g..  Broccoli (boiled), 78 g.  Brussels sprout (boiled), 78 g.  Butternut squash (baked), 103 g.  Chickpea (cooked), 82 g.  Green peas (cooked), 80 g.  Kidney beans (cooked), 5 tbsp / 55 g.  Lima beans (cooked),  c / 43 g.  Navy beans (cooked),  c / 61 g.  Spinach (cooked),  c / 45 g.  Sweet potato (baked),  c / 50 g.  Tomato (chopped or sliced), 90 g.  Vegetable juice.  White mushrooms (cooked), 78 g.  Yam (cooked or baked),  c / 34 g.  Zucchini squash (boiled), 90 g. Other Foods and Drinks  Almonds (whole),  c / 36 g.  Fish, 3 oz / 85 g.  Nonfat fruit variety yogurt, 123 g.  Pistachio nuts, 1 oz / 28 g.  Pumpkin seeds, 1 oz / 28 g.  Red meat (broiled,  cooked, grilled), 3 oz / 85 g.  Scallops (steamed), 3 oz / 85 g.  Spaghetti sauce,  c / 66 g.  Sunflower seeds (dry roasted), 1 oz / 28 g.  Veggie burger, 1 patty / 70 g. Fruits  Grapefruit,  of the fruit / 123 g  Plums (sliced), 83 g.  Tangerine, 1 large / 120 g. Vegetables  Carrots (boiled), 78 g.  Carrots (sliced), 61 g.  Rhubarb (cooked with sugar), 120 g.  Rutabaga (cooked), 120 g.  Yellow snap beans (cooked), 63 g. Other Foods and Drinks   Chicken breast (roasted and chopped),  c / 70 g.  Pita bread, 1 large / 64 g.  Shrimp (steamed), 4 oz / 113 g.  Swiss cheese (diced), 70 g.     VAGINAL DRYNESS OVERVIEW  Vaginal dryness, also known as atrophic vaginitis, is a common condition in postmenopausal women. This condition is also common in women who have had both ovaries removed at the time of hysterectomy.   Some women have uncomfortable symptoms of vaginal dryness, such as pain with sex, burning vaginal discomfort or itching, or abnormal vaginal discharge, while others have no symptoms at all.  VAGINAL DRYNESS CAUSES   Estrogen helps to keep the vagina moist and to maintain thickness of the vaginal  lining. Vaginal dryness occurs when the ovaries produce a decreased amount of estrogen. This can occur at certain times in a woman's life, and may be permanent or temporary. Times when less estrogen is made include: ?At the time of menopause. ?After surgical removal of the ovaries, chemotherapy, or radiation therapy of the pelvis for cancer. ?After having a baby, particularly in women who breastfeed. ?While using certain medications, such as danazol, medroxyprogesterone (brand names: Provera or DepoProvera), leuprolide (brand name: Lupron), or nafarelin. When these medications are stopped, estrogen production resumes.  Women who smoke cigarettes have been shown to have an increased risk of an earlier menopause transition as compared to non-smokers. Therefore,  atrophic vaginitis symptoms may appear at a younger age in this population.  VAGINAL DRYNESS TREATMENT   There are three treatment options for women with vaginal dryness:  Vaginal lubricants and moisturizers - Vaginal lubricants and moisturizers can be purchased without a prescription. These products do not contain any hormones and have virtually no side effects. - Albolene is found in the facial cleanser section at [facility], [facility], or [employer]. It is a large jar with a blue top. This is the best lubricant for women because it is hypoallergenic. -Natural lubricants, such as olive, avocado or peanut oil, are easily available products that may be used as a lubricant with sex.  -Vaginal moisturizes (eg, Replens, Moist Again, Vagisil, K-Y Silk-E, and Feminease) are formulated to allow water to be retained in the vaginal tissues. Moisturizers are applied into the vagina three times weekly to allow a continued moisturizing effect. These should not be used just before having sex, as they can be irritating.  Vaginal estrogen - Vaginal estrogen is the most effective treatment option for women with vaginal dryness. Vaginal estrogen must be prescribed by a healthcare provider. Very low doses of vaginal estrogen can be used when it is put into the vagina to treat vaginal dryness. A small amount of estrogen is absorbed into the bloodstream, but only about 100 times less than when using estrogen pills or tablets. As a result, there is a much lower risk of side effects, such as blood clots, breast cancer, and heart attack, compared with other estrogen-containing products (birth control pills, menopausal hormone therapy).   Ospemifene - Ospemifene is a prescription medication that is similar to estrogen, but is not estrogen. In the vaginal tissue, it acts similarly to estrogen. In the breast tissue, it acts as an estrogen blocker. It comes in a pill, and is prescribed for women who want to use an estrogen-like medication  for vaginal dryness or painful sex associated with vaginal dryness, but prefer not to use a vaginal medication. The medication may cause hot flashes as a side effect. This type of medication may increase the risk of blood clots or uterine cancer. Further study of ospemifene is needed to evaluate the risk of these complications. This medication has not been tested in women who have had breast cancer or are at a high risk of developing breast cancer.    Sexual activity - Vaginal estrogen improves vaginal dryness quickly, usually within a few weeks. You may continue to have sex as you treat vaginal dryness because sex itself can help to keep the vaginal tissues healthy. Vaginal intercourse may help the vaginal tissues by keeping them soft and stretchable and preventing the tissues from shrinking.  If sex continues to be painful despite treatment for vaginal dryness, talk to your healthcare provider.   Before you even begin to attack a  weight-loss plan, it pays to remember this: You are not fat. You have fat. Losing weight isn't about blame or shame; it's simply another achievement to accomplish. Dieting is like any other skill-you have to buckle down and work at it. As long as you act in a smart, reasonable way, you'll ultimately get where you want to be. Here are some weight loss pearls for you.  1. It's Not a Diet. It's a Lifestyle Thinking of a diet as something you're on and suffering through only for the short term doesn't work. To shed weight and keep it off, you need to make permanent changes to the way you eat. It's OK to indulge occasionally, of course, but if you cut calories temporarily and then revert to your old way of eating, you'll gain back the weight quicker than you can say yo-yo. Use it to lose it. Research shows that one of the best predictors of long-term weight loss is how many pounds you drop in the first month. For that reason, nutritionists often suggest being stricter for the first  two weeks of your new eating strategy to build momentum. Cut out added sugar and alcohol and avoid unrefined carbs. After that, figure out how you can reincorporate them in a way that's healthy and maintainable.  2. There's a Right Way to Exercise Working out burns calories and fat and boosts your metabolism by building muscle. But those trying to lose weight are notorious for overestimating the number of calories they burn and underestimating the amount they take in. Unfortunately, your system is biologically programmed to hold on to extra pounds and that means when you start exercising, your body senses the deficit and ramps up its hunger signals. If you're not diligent, you'll eat everything you burn and then some. Use it to lose it. Cardio gets all the exercise glory, but strength and interval training are the real heroes. They help you build lean muscle, which in turn increases your metabolism and calorie-burning ability 3. Don't Overreact to Mild Hunger Some people have a hard time losing weight because of hunger anxiety. To them, being hungry is bad-something to be avoided at all costs-so they carry snacks with them and eat when they don't need to. Others eat because they're stressed out or bored. While you never want to get to the point of being ravenous (that's when bingeing is likely to happen), a hunger pang, a craving, or the fact that it's 3:00 p.m. should not send you racing for the vending machine or obsessing about the energy bar in your purse. Ideally, you should put off eating until your stomach is growling and it's difficult to concentrate.  Use it to lose it. When you feel the urge to eat, use the HALT method. Ask yourself, Am I really hungry? Or am I angry or anxious, lonely or bored, or tired? If you're still not certain, try the apple test. If you're truly hungry, an apple should seem delicious; if it doesn't, something else is going on. Or you can try drinking water and making yourself  busy, if you are still hungry try a healthy snack.  4. Not All Calories Are Created Equal The mechanics of weight loss are pretty simple: Take in fewer calories than you use for energy. But the kind of food you eat makes all the difference. Processed food that's high in saturated fat and refined starch or sugar can cause inflammation that disrupts the hormone signals that tell your brain you're full. The result:  You eat a lot more.  Use it to lose it. Clean up your diet. Swap in whole, unprocessed foods, including vegetables, lean protein, and healthy fats that will fill you up and give you the biggest nutritional bang for your calorie buck. In a few weeks, as your brain starts receiving regular hunger and fullness signals once again, you'll notice that you feel less hungry overall and naturally start cutting back on the amount you eat.  5. Protein, Produce, and Plant-Based Fats Are Your Weight-Loss Trinity Here's why eating the three Ps regularly will help you drop pounds. Protein fills you up. You need it to build lean muscle, which keeps your metabolism humming so that you can torch more fat. People in a weight-loss program who ate double the recommended daily allowance for protein (about 110 grams for a 150-pound woman) lost 70 percent of their weight from fat, while people who ate the RDA lost only about 40 percent, one study found. Produce is packed with filling fiber. "It's very difficult to consume too many calories if you're eating a lot of vegetables. Example: Three cups of broccoli is a lot of food, yet only 93 calories. (Fruit is another story. It can be easy to overeat and can contain a lot of calories from sugar, so be sure to monitor your intake.) Plant-based fats like olive oil and those in avocados and nuts are healthy and extra satiating.  Use it to lose it. Aim to incorporate each of the three Ps into every meal and snack. People who eat protein throughout the day are able to keep weight  off, according to a study in the American Journal of Clinical Nutrition. In addition to meat, poultry and seafood, good sources are beans, lentils, eggs, tofu, and yogurt. As for fat, keep portion sizes in check by measuring out salad dressing, oil, and nut butters (shoot for one to two tablespoons). Finally, eat veggies or a little fruit at every meal. People who did that consumed 308 fewer calories but didn't feel any hungrier than when they didn't eat more produce.  7. How You Eat Is As Important As What You Eat In order for your brain to register that you're full, you need to focus on what you're eating. Sit down whenever you eat, preferably at a table. Turn off the TV or computer, put down your phone, and look at your food. Smell it. Chew slowly, and don't put another bite on your fork until you swallow. When women ate lunch this attentively, they consumed 30 percent less when snacking later than those who listened to an audiobook at lunchtime, according to a study in the [place] Journal of Nutrition. 8. Weighing Yourself Really Works The scale provides the best evidence about whether your efforts are paying off. Seeing the numbers tick up or down or stagnate is motivation to keep going-or to rethink your approach. A 2015 study at [facility] found that daily weigh-ins helped people lose more weight, keep it off, and maintain that loss, even after two years. Use it to lose it. Step on the scale at the same time every day for the best results. If your weight shoots up several pounds from one weigh-in to the next, don't freak out. Eating a lot of salt the night before or having your period is the likely culprit. The number should return to normal in a day or two. It's a steady climb that you need to do something about. 9. Too Much Stress and Too Little  Sleep Are Your Enemies When you're tired and frazzled, your body cranks up the production of cortisol, the stress hormone that can cause carb  cravings. Not getting enough sleep also boosts your levels of ghrelin, a hormone associated with hunger, while suppressing leptin, a hormone that signals fullness and satiety. People on a diet who slept only five and a half hours a night for two weeks lost 55 percent less fat and were hungrier than those who slept eight and a half hours, according to a study in the [place] [employer] Journal. Use it to lose it. Prioritize sleep, aiming for seven hours or more a night, which research shows helps lower stress. And make sure you're getting quality zzz's. If a snoring spouse or a fidgety cat wakes you up frequently throughout the night, you may end up getting the equivalent of just four hours of sleep, according to a study from [facility]. Keep pets out of the bedroom, and use a white-noise app to drown out snoring. 10. You Will Hit a plateau-And You Can Bust Through It As you slim down, your body releases much less leptin, the fullness hormone.  If you're not strength training, start right now. Building muscle can raise your metabolism to help you overcome a plateau. To keep your body challenged and burning calories, incorporate new moves and more intense intervals into your workouts or add another sweat session to your weekly routine. Alternatively, cut an extra 100 calories or so a day from your diet. Now that you've lost weight, your body simply doesn't need as much fuel.   Ways to cut 100 calories  1. Eat your eggs with hot sauce OR salsa instead of cheese.  Eggs are great for breakfast, but many people consider eggs and cheese to be BFFs. Instead of cheese-1 oz. of cheddar has 114 calories-top your eggs with hot sauce, which contains no calories and helps with satiety and metabolism. Salsa is also a great option!!  2. Top your toast, waffles or pancakes with mashed berries instead of jelly or syrup. Half a cup of berries-fresh, frozen or thawed-has about 40 calories, compared with 2  tbsp. of maple syrup or jelly, which both have about 100 calories. The berries will also give you a good punch of fiber, which helps keep you full and satisfied and won't spike blood sugar quickly like the jelly or syrup. 3. Swap the non-fat latte for black coffee with a splash of half-and-half. Contrary to its name, that non-fat latte has 130 calories and a startling 19g of carbohydrates per 16 oz. serving. Replacing that 'light' drinkable dessert with a black coffee with a splash of half-and-half saves you more than 100 calories per 16 oz. serving. 4. Sprinkle salads with freeze-dried raspberries instead of dried cranberries. If you want a sweet addition to your nutritious salad, stay away from dried cranberries. They have a whopping 130 calories per  cup and 30g carbohydrates. Instead, sprinkle freeze-dried raspberries guilt-free and save more than 100 calories per  cup serving, adding 3g of belly-filling fiber. 5. Go for mustard in place of mayo on your sandwich. Mustard can add really nice flavor to any sandwich, and there are tons of varieties, from spicy to honey. A serving of mayo is 95 calories, versus 10 calories in a serving of mustard. 6. Choose a DIY salad dressing instead of the store-bought kind. Mix Dijon or whole grain mustard with low-fat Kefir or red wine vinegar and garlic. 7. Use hummus as a  spread instead of a dip. Use hummus as a spread on a high-fiber cracker or tortilla with a sandwich and save on calories without sacrificing taste. 8. Pick just one salad "accessory." Salad isn't automatically a calorie winner. It's easy to over-accessorize with toppings. Instead of topping your salad with nuts, avocado and cranberries (all three will clock in at 313 calories), just pick one. The next day, choose a different accessory, which will also keep your salad interesting. You don't wear all your jewelry every day, right? 9. Ditch the white pasta in favor of spaghetti squash. One cup  of cooked spaghetti squash has about 40 calories, compared with traditional spaghetti, which comes with more than 200. Spaghetti squash is also nutrient-dense. It's a good source of fiber and Vitamins A and C, and it can be eaten just like you would eat pasta-with a great tomato sauce and [place] meatballs or with pesto, tofu and spinach, for example. 10. Dress up your chili, soups and stews with non-fat [place] yogurt instead of sour cream. Just a 'dollop' of sour cream can set you back 115 calories and a whopping 12g of fat-seven of which are of the artery-clogging variety. Added bonus: [place] yogurt is packed with muscle-building protein, calcium and B Vitamins. 11. Mash cauliflower instead of mashed potatoes. One cup of traditional mashed potatoes-in all their creamy goodness-has more than 200 calories, compared to mashed cauliflower, which you can typically eat for less than 100 calories per 1 cup serving. Cauliflower is a great source of the antioxidant indole-3-carbinol (I3C), which may help reduce the risk of some cancers, like breast cancer. 12. Ditch the ice cream sundae in favor of a [place] yogurt parfait. Instead of a cup of ice cream or fro-yo for dessert, try 1 cup of nonfat Greek yogurt topped with fresh berries and a sprinkle of cacao nibs. Both toppings are packed with antioxidants, which can help reduce cellular inflammation and oxidative damage. And the comparison is a no-brainer: One cup of ice cream has about 275 calories; one cup of frozen yogurt has about 230; and a cup of Greek yogurt has just 130, plus twice the protein, so you're less likely to return to the freezer for a second helping. 13. Put olive oil in a spray container instead of using it directly from the bottle. Each tablespoon of olive oil is 120 calories and 15g of fat. Use a mister instead of pouring it straight into the pan or onto a salad. This allows for portion control and will save you more than 100 calories. 14. When  baking, substitute canned pumpkin for butter or oil. Canned pumpkin-not pumpkin pie mix-is loaded with Vitamin A, which is important for skin and eye health, as well as immunity. And the comparisons are pretty crazy:  cup of canned pumpkin has about 40 calories, compared to butter or oil, which has more than 800 calories. Yes, 800 calories. Applesauce and mashed banana can also serve as good substitutions for butter or oil, usually in a 1:1 ratio. 15. Top casseroles with high-fiber cereal instead of breadcrumbs. Breadcrumbs are typically made with white bread, while breakfast cereals contain 5-9g of fiber per serving. Not only will you save more than 150 calories per  cup serving, the swap will also keep you more full and you'll get a metabolism boost from the added fiber. 16. Snack on pistachios instead of macadamia nuts. Believe it or not, you get the same amount of calories from 35 pistachios (100 calories) as you would  from only five macadamia nuts. 17. Chow down on kale chips rather than potato chips. This is my favorite 'don't knock it 'till you try it' swap. Kale chips are so easy to make at home, and you can spice them up with a little grated parmesan or chili powder. Plus, they're a mere fraction of the calories of potato chips, but with the same crunch factor we crave so often. 18. Add seltzer and some fruit slices to your cocktail instead of soda or fruit juice. One cup of soda or fruit juice can pack on as much as 140 calories. Instead, use seltzer and fruit slices. The fruit provides valuable phytochemicals, such as flavonoids and anthocyanins, which help to combat cancer and stave off the aging process.

## 2015-01-07 NOTE — Progress Notes (Signed)
Complete Physical  Assessment and Plan: 1. Essential hypertension - continue medications, DASH diet, exercise and monitor at home. Call if greater than 130/80.   2. Hyperlipidemia -continue medications, check lipids, decrease fatty foods, increase activity.   3. Obesity  long discussion about weight loss, diet, and exercise, continue weight loss after bypass Has history of OSA, is off due to weight loss-? Need to autotitrate- has plateued with weight.   4. Pre-diabetes Better with weight loss  5. Asthma, unspecified asthma severity, uncomplicated controlled  6. Gastroesophageal reflux disease with esophagitis Continue PPI/H2 blocker, diet discussed  7. Peripheral neuropathy better  8. Allergy, subsequent encounter Continue OTC allergy pills  9. Depression Cut back on lexapro due to inorgasma - buPROPion (WELLBUTRIN XL) 300 MG 24 hr tablet; Take 1 tablet (300 mg total) by mouth every morning.  Dispense: 90 tablet; Refill: 2 - escitalopram (LEXAPRO) 20 MG tablet; Take 1 tablet (20 mg total) by mouth daily.  Dispense: 90 tablet; Refill: 1 - traZODone (DESYREL) 150 MG tablet; Take 1 tablet (150 mg total) by mouth daily with breakfast.  Dispense: 90 tablet; Refill: 1  10. ADD (attention deficit disorder) Continue follow up Dr. Elisabeth MostStevenson  11. S/P gastric bypass Down 100 lbs Testing done at bariatric clinic  12. Family history of breast cancer Get MGM, over due, negative genetic testing  13. Hypokalemia - Magnesium - BASIC METABOLIC PANEL WITH GFR List of healthy foods given  14. Insomnia Insomnia- good sleep hygiene discussed, increase day time activity, try melatonin or benadryl if this does not help we will call in sleep medication.  ? Still need CPAP- will monitor, may need to do auto titration  15. Repeated skin infections She has a history boils, will monitor closely, if she continues to get   16. TDAP Needs to come back in to get or get at drug  store  Discussed med's effects and SE's. Screening labs and tests as requested with regular follow-up as recommended.  HPI  44 y.o. female  presents for a complete physical.  Her blood pressure has been controlled at home, today their BP is BP: 110/70 mmHg She does workout, 4-5 days a week at the gym. She denies chest pain, shortness of breath, dizziness.  She had gastric bypass July 17th 2015 with [facility], she has since lost 102 lbs since that time and she is off CPAP, and off her MF since that time.  She had labs done with the bariatric clinic on 12/31/2014.  Wt Readings from Last 3 Encounters:  01/07/15 231 lb (104.781 kg)  10/03/14 240 lb (108.863 kg)  05/25/14 282 lb 6.4 oz (128.096 kg)   She is not on cholesterol medication and denies myalgias. Her cholesterol is at goal. The cholesterol last visit was: Total was 175, LDL 114, HDL 44, and Trigs was 86.  TSH was 1.17.  Potassium was 3.3, she was not started on a pill at that time.  Her ferritin was 122, folate 12.3, Iron 81, B12 was 998.   She has been working on diet and exercise for prediabetes,  and denies polydipsia, polyuria and visual disturbances. Last A1C in the office was 5.6 from 6.2 a year ago.  Patient is on Vitamin D supplement but was decreased due to elevation.  Was 129 at bariatric clinic.   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  She follows with Dr. Elisabeth MostStevenson for her ADD.  She is on lexapro, wellbutrin, and xanax for depression/anxiety which helps, she  is studying for the GRE for schooling and has been under extra stress, will take max of xanax 2 a day, deciding between public admin doctorate or Academic librarian. She does state she has been having a hard time with climaxing during sex, some vaginal dryness, no painful intercourse, no decrease in libido.  She also complains of repeated boiled/abscess along her AB, she has been treated by bariatric center. + yeast under skin folds on AB  as well.   Current Medications:  Current Outpatient Prescriptions on File Prior to Visit  Medication Sig Dispense Refill  . ALPRAZolam (XANAX) 1 MG tablet take 1 tablet by mouth three times a day if needed 90 tablet 0  . azithromycin (ZITHROMAX) 250 MG tablet 2 tablets by mouth today then one tablet daily for 4 days. 6 tablet 1  . calcium citrate-vitamin D (CITRACAL+D) 315-200 MG-UNIT per tablet Take by mouth.    . Cyanocobalamin (VITAMIN B12 PO) Take by mouth.    . escitalopram (LEXAPRO) 20 MG tablet TAKE ONE TABLET BY MOUTH ONCE DAILY 90 tablet 0  . Methylphenidate HCl ER (QUILLIVANT XR) 25 MG/5ML SUSR Take 12 mg by mouth daily.     . mometasone (NASONEX) 50 MCG/ACT nasal spray Place 1 spray into both nostrils daily.    . mometasone-formoterol (DULERA) 100-5 MCG/ACT AERO Inhale 2 puffs into the lungs daily.    . Multiple Vitamin (MULTI-VITAMINS) TABS Take by mouth.    . nitroGLYCERIN (NITROSTAT) 0.4 MG SL tablet Place 1 tablet (0.4 mg total) under the tongue every 5 (five) minutes as needed for chest pain. 25 tablet 1  . pantoprazole (PROTONIX) 20 MG tablet Take by mouth.    . traZODone (DESYREL) 150 MG tablet TAKE ONE TABLET BY MOUTH ONCE DAILY AT BEDTIME 90 tablet 0  . buPROPion (WELLBUTRIN XL) 300 MG 24 hr tablet Take 1 tablet (300 mg total) by mouth every morning. 90 tablet 2   No current facility-administered medications on file prior to visit.   Health Maintenance:   Immunization History  Administered Date(s) Administered  . Influenza Split 08/08/2013  . Td 04/11/2003   Tetanus: 2004 Pneumovax: M/A Flu vaccine: 2014 Zostavax: N/A LMP: Dec 17 2014, normal, boyfriend with vasectomy, she is off BCP Pap: 2013, never abnormal pap, Dr. Garnet Koyanagi MGM: 2013 DUE DEXA: N/A Colonoscopy: N/A EGD: N/A CXR 11/2013 Ct chest 11/2013 CT AB 06/2000 Stress test 11/2013 Last Dental Exam: No Last Eye Exam:  Yes, unknown name  Patient Care Team: Lucky Cowboy, MD as PCP - General  (Internal Medicine)  Allergies:  Allergies  Allergen Reactions  . Keflex [Cephalexin] Swelling   Medical History:  Past Medical History  Diagnosis Date  . Hypertension   . Hyperlipidemia   . Obesity   . Type II or unspecified type diabetes mellitus without mention of complication, not stated as uncontrolled   . Allergy   . GERD (gastroesophageal reflux disease)   . Depression   . ADD (attention deficit disorder)   . Asthma   . Peripheral neuropathy   . Anxiety    Surgical History:  Past Surgical History  Procedure Laterality Date  . Oophorectomy Right 07/2009  . Wisdom tooth extraction     Family History:  Family History  Problem Relation Age of Onset  . Breast cancer Sister 52  . Breast cancer Sister     late 57s  . Prostate cancer Father   . Healthy Sister   . Stomach cancer Brother 74   Social  History:  History  Substance Use Topics  . Smoking status: Former Smoker -- 0.25 packs/day    Types: Cigarettes    Quit date: 03/19/2014  . Smokeless tobacco: Not on file  . Alcohol Use: 2.4 oz/week    4 Glasses of wine per week     Comment: 4-5 glasses of wine per week   Review of Systems: Review of Systems  Constitutional: Negative.   HENT: Positive for congestion and sore throat. Negative for ear discharge, ear pain, hearing loss, nosebleeds and tinnitus.   Eyes: Negative.   Respiratory: Positive for cough (better since zpak). Negative for hemoptysis, sputum production, shortness of breath, wheezing and stridor.   Gastrointestinal: Negative.   Genitourinary: Negative.   Musculoskeletal: Negative.   Neurological: Positive for headaches. Negative for dizziness, tingling, tremors, sensory change, speech change, focal weakness, seizures and loss of consciousness.  Psychiatric/Behavioral: Negative.     Physical Exam: Estimated body mass index is 40.27 kg/(m^2) as calculated from the following:   Height as of this encounter: 5' 3.5" (1.613 m).   Weight as of this  encounter: 231 lb (104.781 kg). BP 110/70 mmHg  Pulse 88  Temp(Src) 97.9 F (36.6 C)  Resp 16  Ht 5' 3.5" (1.613 m)  Wt 231 lb (104.781 kg)  BMI 40.27 kg/m2 General Appearance: Well nourished, in no apparent distress.  Eyes: PERRLA, EOMs, conjunctiva no swelling or erythema, normal fundi and vessels.  Sinuses: No Frontal/maxillary tenderness  ENT/Mouth: Ext aud canals clear, normal light reflex with TMs without erythema, bulging. Good dentition. No erythema, swelling, or exudate on post pharynx. Tonsils not swollen or erythematous. Hearing normal.  Neck: Supple, thyroid normal. No bruits  Respiratory: Respiratory effort normal, BS equal bilaterally without rales, rhonchi, wheezing or stridor.  Cardio: RRR without murmurs, rubs or gallops. Brisk peripheral pulses without edema.  Chest: symmetric, with normal excursions and percussion.  Breasts: defer OB/GYN Abdomen: Soft, nontender, no guarding, rebound, hernias, masses, or organomegaly. .  Lymphatics: Non tender without lymphadenopathy.  Genitourinary: defer OB/GYN Musculoskeletal: Full ROM all peripheral extremities,5/5 strength, and normal gait.  Skin: Warm, dry without rashes, lesions, ecchymosis. Neuro: Cranial nerves intact, reflexes equal bilaterally. Normal muscle tone, no cerebellar symptoms. Sensation intact.  Psych: Awake and oriented X 3, normal affect, Insight and Judgment appropriate.   EKG: N/A AORTA SCAN: N/A  Quentin Mulling 10:33 AM [facility]

## 2015-01-08 LAB — URINALYSIS, MICROSCOPIC ONLY
Bacteria, UA: NONE SEEN
CASTS: NONE SEEN
Squamous Epithelial / LPF: NONE SEEN

## 2015-01-08 LAB — MICROALBUMIN / CREATININE URINE RATIO
Creatinine, Urine: 332.8 mg/dL
MICROALB UR: 0.8 mg/dL (ref <2.0)
Microalb Creat Ratio: 2.4 mg/g (ref 0.0–30.0)

## 2015-02-07 ENCOUNTER — Encounter: Payer: Self-pay | Admitting: Physician Assistant

## 2015-02-08 ENCOUNTER — Other Ambulatory Visit: Payer: Self-pay

## 2015-02-08 MED ORDER — CETIRIZINE HCL 10 MG PO TABS
10.0000 mg | ORAL_TABLET | Freq: Every day | ORAL | Status: DC
Start: 2015-02-08 — End: 2015-12-27

## 2015-04-15 ENCOUNTER — Other Ambulatory Visit: Payer: Self-pay

## 2015-05-12 ENCOUNTER — Other Ambulatory Visit: Payer: Self-pay | Admitting: Physician Assistant

## 2015-05-13 ENCOUNTER — Other Ambulatory Visit: Payer: Self-pay | Admitting: Physician Assistant

## 2015-05-13 DIAGNOSIS — F329 Major depressive disorder, single episode, unspecified: Secondary | ICD-10-CM

## 2015-05-13 DIAGNOSIS — F32A Depression, unspecified: Secondary | ICD-10-CM

## 2015-05-13 MED ORDER — TRAZODONE HCL 150 MG PO TABS: 150.0000 mg | ORAL_TABLET | Freq: Every day | ORAL | Status: DC

## 2015-05-15 ENCOUNTER — Encounter: Payer: Self-pay | Admitting: Physician Assistant

## 2015-05-16 ENCOUNTER — Encounter: Payer: Self-pay | Admitting: Physician Assistant

## 2015-05-16 DIAGNOSIS — F410 Panic disorder [episodic paroxysmal anxiety] without agoraphobia: Secondary | ICD-10-CM

## 2015-05-16 DIAGNOSIS — F429 Obsessive-compulsive disorder, unspecified: Secondary | ICD-10-CM | POA: Insufficient documentation

## 2015-05-16 DIAGNOSIS — F064 Anxiety disorder due to known physiological condition: Secondary | ICD-10-CM | POA: Insufficient documentation

## 2015-06-29 ENCOUNTER — Inpatient Hospital Stay (HOSPITAL_COMMUNITY)
Admission: AD | Admit: 2015-06-29 | Discharge: 2015-06-29 | Disposition: A | Discharge status: Home / Self Care | Source: Ambulatory Visit | Attending: Obstetrics and Gynecology | Admitting: Obstetrics and Gynecology

## 2015-06-29 ENCOUNTER — Inpatient Hospital Stay (HOSPITAL_COMMUNITY)

## 2015-06-29 ENCOUNTER — Encounter (HOSPITAL_COMMUNITY): Payer: Self-pay | Admitting: *Deleted

## 2015-06-29 DIAGNOSIS — K219 Gastro-esophageal reflux disease without esophagitis: Secondary | ICD-10-CM | POA: Insufficient documentation

## 2015-06-29 DIAGNOSIS — F329 Major depressive disorder, single episode, unspecified: Secondary | ICD-10-CM | POA: Diagnosis not present

## 2015-06-29 DIAGNOSIS — Z825 Family history of asthma and other chronic lower respiratory diseases: Secondary | ICD-10-CM | POA: Insufficient documentation

## 2015-06-29 DIAGNOSIS — Z8042 Family history of malignant neoplasm of prostate: Secondary | ICD-10-CM | POA: Diagnosis not present

## 2015-06-29 DIAGNOSIS — Z8249 Family history of ischemic heart disease and other diseases of the circulatory system: Secondary | ICD-10-CM | POA: Diagnosis not present

## 2015-06-29 DIAGNOSIS — F419 Anxiety disorder, unspecified: Secondary | ICD-10-CM | POA: Insufficient documentation

## 2015-06-29 DIAGNOSIS — E669 Obesity, unspecified: Secondary | ICD-10-CM | POA: Diagnosis not present

## 2015-06-29 DIAGNOSIS — Z803 Family history of malignant neoplasm of breast: Secondary | ICD-10-CM | POA: Insufficient documentation

## 2015-06-29 DIAGNOSIS — J45909 Unspecified asthma, uncomplicated: Secondary | ICD-10-CM | POA: Diagnosis not present

## 2015-06-29 DIAGNOSIS — R1032 Left lower quadrant pain: Secondary | ICD-10-CM

## 2015-06-29 DIAGNOSIS — Z87891 Personal history of nicotine dependence: Secondary | ICD-10-CM | POA: Insufficient documentation

## 2015-06-29 DIAGNOSIS — R109 Unspecified abdominal pain: Secondary | ICD-10-CM

## 2015-06-29 DIAGNOSIS — Z8041 Family history of malignant neoplasm of ovary: Secondary | ICD-10-CM | POA: Diagnosis not present

## 2015-06-29 DIAGNOSIS — G629 Polyneuropathy, unspecified: Secondary | ICD-10-CM | POA: Insufficient documentation

## 2015-06-29 DIAGNOSIS — E119 Type 2 diabetes mellitus without complications: Secondary | ICD-10-CM | POA: Insufficient documentation

## 2015-06-29 DIAGNOSIS — E785 Hyperlipidemia, unspecified: Secondary | ICD-10-CM | POA: Diagnosis not present

## 2015-06-29 DIAGNOSIS — F42 Obsessive-compulsive disorder: Secondary | ICD-10-CM | POA: Diagnosis not present

## 2015-06-29 DIAGNOSIS — Z833 Family history of diabetes mellitus: Secondary | ICD-10-CM | POA: Diagnosis not present

## 2015-06-29 HISTORY — DX: Gestational diabetes mellitus in pregnancy, unspecified control: O24.419

## 2015-06-29 HISTORY — DX: Unspecified infectious disease: B99.9

## 2015-06-29 HISTORY — DX: Unspecified ovarian cyst, unspecified side: N83.209

## 2015-06-29 HISTORY — DX: Obsessive-compulsive disorder, unspecified: F42.9

## 2015-06-29 LAB — COMPREHENSIVE METABOLIC PANEL
ALBUMIN: 3.8 g/dL (ref 3.5–5.0)
ALK PHOS: 37 U/L — AB (ref 38–126)
ALT: 21 U/L (ref 14–54)
AST: 20 U/L (ref 15–41)
Anion gap: 7 (ref 5–15)
BILIRUBIN TOTAL: 0.5 mg/dL (ref 0.3–1.2)
BUN: 16 mg/dL (ref 6–20)
CALCIUM: 9.1 mg/dL (ref 8.9–10.3)
CO2: 31 mmol/L (ref 22–32)
CREATININE: 0.79 mg/dL (ref 0.44–1.00)
Chloride: 101 mmol/L (ref 101–111)
GFR calc Af Amer: >60 mL/min (ref >60)
GFR calc non Af Amer: >60 mL/min (ref >60)
GLUCOSE: 89 mg/dL (ref 65–99)
Potassium: 4.4 mmol/L (ref 3.5–5.1)
SODIUM: 139 mmol/L (ref 135–145)
Total Protein: 6.7 g/dL (ref 6.5–8.1)

## 2015-06-29 LAB — CBC WITH DIFFERENTIAL/PLATELET
BASOS ABS: 0 10*3/uL (ref 0.0–0.1)
Basophils Relative: 1 % (ref 0–1)
EOS PCT: 4 % (ref 0–5)
Eosinophils Absolute: 0.2 10*3/uL (ref 0.0–0.7)
HEMATOCRIT: 36.3 % (ref 36.0–46.0)
Hemoglobin: 12 g/dL (ref 12.0–15.0)
Lymphocytes Relative: 49 % — ABNORMAL HIGH (ref 12–46)
Lymphs Abs: 2.2 10*3/uL (ref 0.7–4.0)
MCH: 27.4 pg (ref 26.0–34.0)
MCHC: 33.1 g/dL (ref 30.0–36.0)
MCV: 82.9 fL (ref 78.0–100.0)
MONOS PCT: 5 % (ref 3–12)
Monocytes Absolute: 0.2 10*3/uL (ref 0.1–1.0)
NEUTROS ABS: 1.9 10*3/uL (ref 1.7–7.7)
Neutrophils Relative %: 41 % — ABNORMAL LOW (ref 43–77)
PLATELETS: 242 10*3/uL (ref 150–400)
RBC: 4.38 MIL/uL (ref 3.87–5.11)
RDW: 13.8 % (ref 11.5–15.5)
WBC: 4.5 10*3/uL (ref 4.0–10.5)

## 2015-06-29 LAB — URINALYSIS, ROUTINE W REFLEX MICROSCOPIC
Bilirubin Urine: NEGATIVE
GLUCOSE, UA: NEGATIVE mg/dL
Hgb urine dipstick: NEGATIVE
Ketones, ur: NEGATIVE mg/dL
LEUKOCYTES UA: NEGATIVE
Nitrite: NEGATIVE
PH: 6 (ref 5.0–8.0)
Protein, ur: NEGATIVE mg/dL
Specific Gravity, Urine: >1.03 — ABNORMAL HIGH (ref 1.005–1.030)
Urobilinogen, UA: 0.2 mg/dL (ref 0.0–1.0)

## 2015-06-29 LAB — WET PREP, GENITAL
Clue Cells Wet Prep HPF POC: NONE SEEN
Trich, Wet Prep: NONE SEEN
Yeast Wet Prep HPF POC: NONE SEEN

## 2015-06-29 LAB — POCT PREGNANCY, URINE: Preg Test, Ur: NEGATIVE

## 2015-06-29 MED ORDER — KETOROLAC TROMETHAMINE 60 MG/2ML IM SOLN
60.0000 mg | Freq: Once | INTRAMUSCULAR | Status: AC
  Administered 2015-06-29: 60 mg via INTRAMUSCULAR
  Filled 2015-06-29: qty 2

## 2015-06-29 MED ORDER — TRAMADOL HCL 50 MG PO TABS: 50.0000 mg | ORAL_TABLET | Freq: Four times a day (QID) | ORAL | Status: DC | PRN

## 2015-06-29 MED ORDER — TRAMADOL HCL 50 MG PO TABS
50.0000 mg | ORAL_TABLET | Freq: Once | ORAL | Status: AC
  Administered 2015-06-29: 50 mg via ORAL
  Filled 2015-06-29: qty 1

## 2015-06-29 NOTE — MAU Note (Addendum)
Pain started a couple wks ago with onset of cycle.  Irregular bleeding, started as very dark and with clots, then became like regular cycle, stopped and started back.  No longer bleeding, but the pain continues

## 2015-06-29 NOTE — Discharge Instructions (Signed)
Abdominal Pain, Women °Abdominal (stomach, pelvic, or belly) pain can be caused by many things. It is important to tell your doctor: °· The location of the pain. °· Does it come and go or is it present all the time? °· Are there things that start the pain (eating certain foods, exercise)? °· Are there other symptoms associated with the pain (fever, nausea, vomiting, diarrhea)? °All of this is helpful to know when trying to find the cause of the pain. °CAUSES  °· Stomach: virus or bacteria infection, or ulcer. °· Intestine: appendicitis (inflamed appendix), regional ileitis (Crohn's disease), ulcerative colitis (inflamed colon), irritable bowel syndrome, diverticulitis (inflamed diverticulum of the colon), or cancer of the stomach or intestine. °· Gallbladder disease or stones in the gallbladder. °· Kidney disease, kidney stones, or infection. °· Pancreas infection or cancer. °· Fibromyalgia (pain disorder). °· Diseases of the female organs: °¨ Uterus: fibroid (non-cancerous) tumors or infection. °¨ Fallopian tubes: infection or tubal pregnancy. °¨ Ovary: cysts or tumors. °¨ Pelvic adhesions (scar tissue). °¨ Endometriosis (uterus lining tissue growing in the pelvis and on the pelvic organs). °¨ Pelvic congestion syndrome (female organs filling up with blood just before the menstrual period). °¨ Pain with the menstrual period. °¨ Pain with ovulation (producing an egg). °¨ Pain with an IUD (intrauterine device, birth control) in the uterus. °¨ Cancer of the female organs. °· Functional pain (pain not caused by a disease, may improve without treatment). °· Psychological pain. °· Depression. °DIAGNOSIS  °Your doctor will decide the seriousness of your pain by doing an examination. °· Blood tests. °· X-rays. °· Ultrasound. °· CT scan (computed tomography, special type of X-ray). °· MRI (magnetic resonance imaging). °· Cultures, for infection. °· Barium enema (dye inserted in the large intestine, to better view it with  X-rays). °· Colonoscopy (looking in intestine with a lighted tube). °· Laparoscopy (minor surgery, looking in abdomen with a lighted tube). °· Major abdominal exploratory surgery (looking in abdomen with a large incision). °TREATMENT  °The treatment will depend on the cause of the pain.  °· Many cases can be observed and treated at home. °· Over-the-counter medicines recommended by your caregiver. °· Prescription medicine. °· Antibiotics, for infection. °· Birth control pills, for painful periods or for ovulation pain. °· Hormone treatment, for endometriosis. °· Nerve blocking injections. °· Physical therapy. °· Antidepressants. °· Counseling with a psychologist or psychiatrist. °· Minor or major surgery. °HOME CARE INSTRUCTIONS  °· Do not take laxatives, unless directed by your caregiver. °· Take over-the-counter pain medicine only if ordered by your caregiver. Do not take aspirin because it can cause an upset stomach or bleeding. °· Try a clear liquid diet (broth or water) as ordered by your caregiver. Slowly move to a bland diet, as tolerated, if the pain is related to the stomach or intestine. °· Have a thermometer and take your temperature several times a day, and record it. °· Bed rest and sleep, if it helps the pain. °· Avoid sexual intercourse, if it causes pain. °· Avoid stressful situations. °· Keep your follow-up appointments and tests, as your caregiver orders. °· If the pain does not go away with medicine or surgery, you may try: °¨ Acupuncture. °¨ Relaxation exercises (yoga, meditation). °¨ Group therapy. °¨ Counseling. °SEEK MEDICAL CARE IF:  °· You notice certain foods cause stomach pain. °· Your home care treatment is not helping your pain. °· You need stronger pain medicine. °· You want your IUD removed. °· You feel faint or   lightheaded. °· You develop nausea and vomiting. °· You develop a rash. °· You are having side effects or an allergy to your medicine. °SEEK IMMEDIATE MEDICAL CARE IF:  °· Your  pain does not go away or gets worse. °· You have a fever. °· Your pain is felt only in portions of the abdomen. The right side could possibly be appendicitis. The left lower portion of the abdomen could be colitis or diverticulitis. °· You are passing blood in your stools (bright red or black tarry stools, with or without vomiting). °· You have blood in your urine. °· You develop chills, with or without a fever. °· You pass out. °MAKE SURE YOU:  °· Understand these instructions. °· Will watch your condition. °· Will get help right away if you are not doing well or get worse. °Document Released: 08/02/2007 Document Revised: 02/19/2014 Document Reviewed: 08/22/2009 °Exit[employer]® Patient Information ©2015 [employer]. This information is not intended to replace advice given to you by your health care provider. Make sure you discuss any questions you have with your health care provider. ° °

## 2015-06-29 NOTE — MAU Provider Note (Signed)
History     CSN: [id number]  Arrival date and time: 06/29/15 9476   First Provider Initiated Contact with Patient 06/29/15 1027      Chief Complaint  Patient presents with  . Abdominal Pain   HPI    Ms.Bridget Watson is a 44 y.o. [phone] presenting with abdominal pain and lower back that started 2.5 weeks ago. She had a very painful and heavy cycle on 8/19 and was wondering if this cycle may have caused her pain. She was taking pamprin for main management which helped only some.   History of oophorectomy due to large cyst; left side.   The pain is located in her lower abdomen; noted on both sides. The pain in her lower back is only on the left side. She has not taken anything for pain this morning.  She currently rates her pain 7/10.   She is not having any bleeding now  Denies abnormal vaginal discharge.   OB History    Gravida Para Term Preterm AB TAB SAB Ectopic Multiple Living   5 3 3  2 1 1   6       Past Medical History  Diagnosis Date  . Hyperlipidemia   . Obesity   . Type II or unspecified type diabetes mellitus without mention of complication, not stated as uncontrolled   . Allergy   . GERD (gastroesophageal reflux disease)   . Depression   . ADD (attention deficit disorder)   . Asthma   . Peripheral neuropathy   . Anxiety   . Preterm labor   . Gestational diabetes   . Infection     UTI  . Ovarian cyst   . OCD (obsessive compulsive disorder)     Past Surgical History  Procedure Laterality Date  . Oophorectomy Right 07/2009  . Wisdom tooth extraction    . Gastric bypass  04/2014    [facility]    Family History  Problem Relation Age of Onset  . Ovarian cancer Sister 10  . Breast cancer Sister 47  . Prostate cancer Father   . Healthy Sister   . Stomach cancer Brother 105  . Diabetes Mother   . Heart disease Mother   . Diabetes Son   . Asthma Son     Social History  Substance Use Topics  . Smoking status: Former Smoker -- 0.25 packs/day   Types: Cigarettes    Quit date: 03/19/2014  . Smokeless tobacco: Never Used  . Alcohol Use: 2.4 oz/week    4 Glasses of wine per week     Comment: 4-5 glasses of wine per week    Allergies:  Allergies  Allergen Reactions  . Keflex [Cephalexin] Swelling    Prescriptions prior to admission  Medication Sig Dispense Refill Last Dose  . ALPRAZolam (XANAX) 1 MG tablet take 1 tablet by mouth three times a day if needed 90 tablet 1   . azithromycin (ZITHROMAX) 250 MG tablet 2 tablets by mouth today then one tablet daily for 4 days. 6 tablet 1 Taking  . buPROPion (WELLBUTRIN XL) 300 MG 24 hr tablet Take 1 tablet (300 mg total) by mouth every morning. 90 tablet 2   . calcium citrate-vitamin D (CITRACAL+D) 315-200 MG-UNIT per tablet Take by mouth.   Taking  . cetirizine (ZYRTEC) 10 MG tablet Take 1 tablet (10 mg total) by mouth daily. 90 tablet PRN   . Cyanocobalamin (VITAMIN B12 PO) Take by mouth.   Taking  . escitalopram (LEXAPRO) 20 MG tablet  Take 1 tablet (20 mg total) by mouth daily. 90 tablet 1   . Methylphenidate HCl ER (QUILLIVANT XR) 25 MG/5ML SUSR Take 12 mg by mouth daily.    Taking  . traZODone (DESYREL) 150 MG tablet Take 1 tablet (150 mg total) by mouth daily with breakfast. 90 tablet 1    Results for orders placed or performed during the hospital encounter of 06/29/15 (from the past 48 hour(s))  Urinalysis, Routine w reflex microscopic (not at [facility])     Status: Abnormal   Collection Time: 06/29/15 10:05 AM  Result Value Ref Range   Color, Urine YELLOW YELLOW   APPearance CLEAR CLEAR   Specific Gravity, Urine >1.030 (H) 1.005 - 1.030   pH 6.0 5.0 - 8.0   Glucose, UA NEGATIVE NEGATIVE mg/dL   Hgb urine dipstick NEGATIVE NEGATIVE   Bilirubin Urine NEGATIVE NEGATIVE   Ketones, ur NEGATIVE NEGATIVE mg/dL   Protein, ur NEGATIVE NEGATIVE mg/dL   Urobilinogen, UA 0.2 0.0 - 1.0 mg/dL   Nitrite NEGATIVE NEGATIVE   Leukocytes, UA NEGATIVE NEGATIVE    Comment: MICROSCOPIC NOT DONE  ON URINES WITH NEGATIVE PROTEIN, BLOOD, LEUKOCYTES, NITRITE, OR GLUCOSE <1000 mg/dL.  Pregnancy, urine POC     Status: None   Collection Time: 06/29/15 10:50 AM  Result Value Ref Range   Preg Test, Ur NEGATIVE NEGATIVE    Comment:        THE SENSITIVITY OF THIS METHODOLOGY IS >24 mIU/mL   CBC with Differential     Status: Abnormal   Collection Time: 06/29/15 11:10 AM  Result Value Ref Range   WBC 4.5 4.0 - 10.5 K/uL   RBC 4.38 3.87 - 5.11 MIL/uL   Hemoglobin 12.0 12.0 - 15.0 g/dL   HCT 36.3 36.0 - 46.0 %   MCV 82.9 78.0 - 100.0 fL   MCH 27.4 26.0 - 34.0 pg   MCHC 33.1 30.0 - 36.0 g/dL   RDW 13.8 11.5 - 15.5 %   Platelets 242 150 - 400 K/uL   Neutrophils Relative % 41 (L) 43 - 77 %   Neutro Abs 1.9 1.7 - 7.7 K/uL   Lymphocytes Relative 49 (H) 12 - 46 %   Lymphs Abs 2.2 0.7 - 4.0 K/uL   Monocytes Relative 5 3 - 12 %   Monocytes Absolute 0.2 0.1 - 1.0 K/uL   Eosinophils Relative 4 0 - 5 %   Eosinophils Absolute 0.2 0.0 - 0.7 K/uL   Basophils Relative 1 0 - 1 %   Basophils Absolute 0.0 0.0 - 0.1 K/uL  Wet prep, genital     Status: Abnormal   Collection Time: 06/29/15 11:25 AM  Result Value Ref Range   Yeast Wet Prep HPF POC NONE SEEN NONE SEEN   Trich, Wet Prep NONE SEEN NONE SEEN   Clue Cells Wet Prep HPF POC NONE SEEN NONE SEEN   WBC, Wet Prep HPF POC FEW (A) NONE SEEN    Comment: MODERATE BACTERIA SEEN  Comprehensive metabolic panel     Status: Abnormal   Collection Time: 06/29/15 12:55 PM  Result Value Ref Range   Sodium 139 135 - 145 mmol/L   Potassium 4.4 3.5 - 5.1 mmol/L   Chloride 101 101 - 111 mmol/L   CO2 31 22 - 32 mmol/L   Glucose, Bld 89 65 - 99 mg/dL   BUN 16 6 - 20 mg/dL   Creatinine, Ser 0.79 0.44 - 1.00 mg/dL   Calcium 9.1 8.9 - 10.3 mg/dL  Total Protein 6.7 6.5 - 8.1 g/dL   Albumin 3.8 3.5 - 5.0 g/dL   AST 20 15 - 41 U/L   ALT 21 14 - 54 U/L   Alkaline Phosphatase 37 (L) 38 - 126 U/L   Total Bilirubin 0.5 0.3 - 1.2 mg/dL   GFR calc non Af Amer  >60 >60 mL/min   GFR calc Af Amer >60 >60 mL/min    Comment: (NOTE) The eGFR has been calculated using the CKD EPI equation. This calculation has not been validated in all clinical situations. eGFR's persistently <60 mL/min signify possible Chronic Kidney Disease.    Anion gap 7 5 - 15    Review of Systems  Constitutional: Negative for fever.  Gastrointestinal: Positive for abdominal pain. Negative for nausea, vomiting, diarrhea and constipation.  Genitourinary: Negative for dysuria.       History of uterine fibroids.   Musculoskeletal: Positive for back pain.   Physical Exam   Blood pressure 113/62, pulse 83, temperature 98.2 F (36.8 C), temperature source Oral, resp. rate 18, weight 103.42 kg (228 lb), last menstrual period 06/07/2015.  Physical Exam  Constitutional: She is oriented to person, place, and time. Vital signs are normal. She appears well-developed and well-nourished.  Non-toxic appearance. She does not have a sickly appearance. She does not appear ill. No distress.  HENT:  Head: Normocephalic.  Eyes: Pupils are equal, round, and reactive to light.  Neck: Neck supple.  Respiratory: Effort normal.  GI: There is tenderness in the suprapubic area and left lower quadrant. There is rigidity. There is no rebound and no guarding.  Genitourinary:  Bimanual exam: Cervix closed, no CMT  Uterus non tender, normal size Left Adnexal tenderness, no masses bilaterally Wet prep done Chaperone present for exam.  Musculoskeletal: Normal range of motion.  Neurological: She is alert and oriented to person, place, and time.  Skin: Skin is warm. She is not diaphoretic.  Psychiatric: Her behavior is normal.    MAU Course  Procedures  None  MDM  Toradol 60 mg given IM Patient states her pain is 0/10 after Toradol.  Discussed US>Labs>HPI with Dr. Ulanda Edison. We both agreed that this is likely not GYN in nature.  Patient returned 4/10 after [place]: 1 ultram given.  Patient is  scheduled see Dr. Terri Piedra on Monday and is encouraged to keep this appointment.   Assessment and Plan   A:  1. Left lower quadrant pain   2. Abdominal pain    P:  Discharge home in stable condition.  Go to [facility] or [facility] if symptoms worsen Follow up with GYN on Monday Constipation reviewed     Lezlie Lye, NP 06/29/2015 12:48 PM

## 2015-07-05 ENCOUNTER — Other Ambulatory Visit: Payer: Self-pay | Admitting: Obstetrics and Gynecology

## 2015-07-05 DIAGNOSIS — R928 Other abnormal and inconclusive findings on diagnostic imaging of breast: Secondary | ICD-10-CM

## 2015-07-10 ENCOUNTER — Ambulatory Visit (INDEPENDENT_AMBULATORY_CARE_PROVIDER_SITE_OTHER): Admitting: Physician Assistant

## 2015-07-10 ENCOUNTER — Encounter: Payer: Self-pay | Admitting: Physician Assistant

## 2015-07-10 VITALS — BP 118/62 | HR 84 | Temp 98.1°F | Resp 16 | Ht 63.5 in | Wt 237.4 lb

## 2015-07-10 DIAGNOSIS — E669 Obesity, unspecified: Secondary | ICD-10-CM

## 2015-07-10 DIAGNOSIS — F329 Major depressive disorder, single episode, unspecified: Secondary | ICD-10-CM

## 2015-07-10 DIAGNOSIS — F32A Depression, unspecified: Secondary | ICD-10-CM

## 2015-07-10 DIAGNOSIS — I10 Essential (primary) hypertension: Secondary | ICD-10-CM

## 2015-07-10 DIAGNOSIS — Z9884 Bariatric surgery status: Secondary | ICD-10-CM

## 2015-07-10 DIAGNOSIS — R7303 Prediabetes: Secondary | ICD-10-CM

## 2015-07-10 DIAGNOSIS — R7309 Other abnormal glucose: Secondary | ICD-10-CM

## 2015-07-10 DIAGNOSIS — Z79899 Other long term (current) drug therapy: Secondary | ICD-10-CM

## 2015-07-10 DIAGNOSIS — E559 Vitamin D deficiency, unspecified: Secondary | ICD-10-CM | POA: Diagnosis not present

## 2015-07-10 DIAGNOSIS — E785 Hyperlipidemia, unspecified: Secondary | ICD-10-CM

## 2015-07-10 LAB — CBC WITH DIFFERENTIAL/PLATELET
BASOS ABS: 0 10*3/uL (ref 0.0–0.1)
BASOS PCT: 0 % (ref 0–1)
EOS ABS: 0.1 10*3/uL (ref 0.0–0.7)
EOS PCT: 1 % (ref 0–5)
HCT: 38.7 % (ref 36.0–46.0)
Hemoglobin: 12.6 g/dL (ref 12.0–15.0)
Lymphocytes Relative: 37 % (ref 12–46)
Lymphs Abs: 2.5 10*3/uL (ref 0.7–4.0)
MCH: 27.1 pg (ref 26.0–34.0)
MCHC: 32.6 g/dL (ref 30.0–36.0)
MCV: 83.2 fL (ref 78.0–100.0)
MPV: 11 fL (ref 8.6–12.4)
Monocytes Absolute: 0.3 10*3/uL (ref 0.1–1.0)
Monocytes Relative: 4 % (ref 3–12)
Neutro Abs: 3.9 10*3/uL (ref 1.7–7.7)
Neutrophils Relative %: 58 % (ref 43–77)
PLATELETS: 285 10*3/uL (ref 150–400)
RBC: 4.65 MIL/uL (ref 3.87–5.11)
RDW: 14.1 % (ref 11.5–15.5)
WBC: 6.8 10*3/uL (ref 4.0–10.5)

## 2015-07-10 LAB — BASIC METABOLIC PANEL WITH GFR
BUN: 12 mg/dL (ref 7–25)
CALCIUM: 8.9 mg/dL (ref 8.6–10.2)
CO2: 26 mmol/L (ref 20–31)
Chloride: 105 mmol/L (ref 98–110)
Creat: 0.8 mg/dL (ref 0.50–1.10)
Glucose, Bld: 105 mg/dL — ABNORMAL HIGH (ref 65–99)
POTASSIUM: 3.9 mmol/L (ref 3.5–5.3)
Sodium: 138 mmol/L (ref 135–146)

## 2015-07-10 LAB — LIPID PANEL
CHOLESTEROL: 174 mg/dL (ref 125–200)
HDL: 51 mg/dL (ref >=46)
LDL CALC: 99 mg/dL (ref <130)
TRIGLYCERIDES: 119 mg/dL (ref <150)
Total CHOL/HDL Ratio: 3.4 Ratio (ref <=5.0)
VLDL: 24 mg/dL (ref <30)

## 2015-07-10 LAB — HEPATIC FUNCTION PANEL
ALBUMIN: 3.8 g/dL (ref 3.6–5.1)
ALK PHOS: 34 U/L (ref 33–115)
ALT: 17 U/L (ref 6–29)
AST: 17 U/L (ref 10–30)
BILIRUBIN INDIRECT: 0.3 mg/dL (ref 0.2–1.2)
BILIRUBIN TOTAL: 0.4 mg/dL (ref 0.2–1.2)
Bilirubin, Direct: 0.1 mg/dL (ref <=0.2)
Total Protein: 6.2 g/dL (ref 6.1–8.1)

## 2015-07-10 LAB — MAGNESIUM: Magnesium: 1.8 mg/dL (ref 1.5–2.5)

## 2015-07-10 LAB — TSH: TSH: 0.572 u[IU]/mL (ref 0.350–4.500)

## 2015-07-10 MED ORDER — TRAZODONE HCL 150 MG PO TABS: 150.0000 mg | ORAL_TABLET | Freq: Every day | ORAL | Status: DC

## 2015-07-10 MED ORDER — TRAMADOL HCL 50 MG PO TABS: 50.0000 mg | ORAL_TABLET | Freq: Four times a day (QID) | ORAL | Status: DC | PRN

## 2015-07-10 MED ORDER — BUPROPION HCL ER (XL) 300 MG PO TB24: 300.0000 mg | ORAL_TABLET | ORAL | Status: DC

## 2015-07-10 MED ORDER — FLUTICASONE PROPIONATE 50 MCG/ACT NA SUSP: 2.0000 | Freq: Every day | NASAL | Status: DC

## 2015-07-10 MED ORDER — ESCITALOPRAM OXALATE 20 MG PO TABS: 20.0000 mg | ORAL_TABLET | Freq: Every day | ORAL | Status: DC

## 2015-07-10 NOTE — Progress Notes (Signed)
Assessment and Plan:  1. Hypertension -Continue medication, monitor blood pressure at home. Continue DASH diet.  Reminder to go to the ER if any CP, SOB, nausea, dizziness, severe HA, changes vision/speech, left arm numbness and tingling and jaw pain.  2. Cholesterol -Continue diet and exercise. Check cholesterol.   3. Prediabetes  -Continue diet and exercise. Check A1C  4. Vitamin D Def - check level and continue medications.   5. Obesity with co morbidities s/p gastric bypass - long discussion about weight loss, diet, and exercise  6. Ear effusion, no infection -Allergy pill, flonase, autoinflation, explained no need for ABX at this time.    Continue diet and meds as discussed. Further disposition pending results of labs. Over 30 minutes of exam, counseling, chart review, and critical decision making was performed  HPI 44 y.o. female  presents for 3 month follow up on hypertension, cholesterol, prediabetes, and vitamin D deficiency.   Her blood pressure has been controlled at home, today their BP is BP: 118/62 mmHg  She does workout, 1 hour of cardio and 30 mins weight training 4 days a week.  She denies chest pain, shortness of breath, dizziness.  She is not on cholesterol medication and denies myalgias. Her cholesterol is at goal. The cholesterol last visit was:   She has been working on diet and exercise for prediabetes, and denies paresthesia of the feet, polydipsia, polyuria and visual disturbances. Last A1C in the office was:  Lab Results  Component Value Date   HGBA1C 6.2* 12/02/2013   Patient is on Vitamin D supplement, was 129, takes 1400-1800 IU daily.    She had LLQ pain and went to urgent care for abnormal bleeding and pain, followed up with GYN, had pap and MGM.  Her asthma and allergies have been well controlled recently.  She has depression and ADD follows with Dr. Elisabeth Most.  BMI is Body mass index is 41.39 kg/(m^2)., she is working on diet and exercise, and she  is s/p gastric bypass on July 17th 2015.. Wt Readings from Last 09 Encounters:  07/10/15 237 lb 6.4 oz (107.684 kg)  06/29/15 228 lb (103.42 kg)  01/07/15 231 lb (104.781 kg)  10/03/14 240 lb (108.863 kg)  05/25/14 282 lb 6.4 oz (128.096 kg)  02/15/14 303 lb (137.44 kg)  12/15/13 302 lb (136.986 kg)  12/02/13 306 lb (138.8 kg)  11/30/13 296 lb (134.265 kg)      Current Medications:  Current Outpatient Prescriptions on File Prior to Visit  Medication Sig Dispense Refill  . ALPRAZolam (XANAX) 1 MG tablet take 1 tablet by mouth three times a day if needed 90 tablet 1  . azithromycin (ZITHROMAX) 250 MG tablet 2 tablets by mouth today then one tablet daily for 4 days. (Patient not taking: Reported on 06/29/2015) 6 tablet 1  . buPROPion (WELLBUTRIN XL) 300 MG 24 hr tablet Take 1 tablet (300 mg total) by mouth every morning. 90 tablet 2  . calcium citrate-vitamin D (CITRACAL+D) 315-200 MG-UNIT per tablet Take by mouth.    . cetirizine (ZYRTEC) 10 MG tablet Take 1 tablet (10 mg total) by mouth daily. (Patient not taking: Reported on 06/29/2015) 90 tablet PRN  . Cyanocobalamin (VITAMIN B-12) 5000 MCG SUBL Place 1 tablet under the tongue daily.    Marland Kitchen escitalopram (LEXAPRO) 20 MG tablet Take 1 tablet (20 mg total) by mouth daily. 90 tablet 1  . traMADol (ULTRAM) 50 MG tablet Take 1 tablet (50 mg total) by mouth every 6 (six) hours  as needed. 8 tablet 0  . traZODone (DESYREL) 150 MG tablet Take 1 tablet (150 mg total) by mouth daily with breakfast. 90 tablet 1   No current facility-administered medications on file prior to visit.   Medical History:  Past Medical History  Diagnosis Date  . Hyperlipidemia   . Obesity   . Type II or unspecified type diabetes mellitus without mention of complication, not stated as uncontrolled   . Allergy   . GERD (gastroesophageal reflux disease)   . Depression   . ADD (attention deficit disorder)   . Asthma   . Peripheral neuropathy   . Anxiety   . Preterm  labor   . Gestational diabetes   . Infection     UTI  . Ovarian cyst   . OCD (obsessive compulsive disorder)    Allergies:  Allergies  Allergen Reactions  . Keflex [Cephalexin] Swelling     Review of Systems:  Review of Systems  Constitutional: Negative.   HENT: Positive for congestion, ear pain and tinnitus. Negative for ear discharge, hearing loss, nosebleeds and sore throat.   Eyes: Negative.   Respiratory: Negative.  Negative for stridor.   Cardiovascular: Negative.   Gastrointestinal: Positive for constipation. Negative for heartburn, nausea, vomiting, abdominal pain, diarrhea, blood in stool and melena.  Genitourinary: Negative.   Musculoskeletal: Negative.   Skin: Negative.   Neurological: Negative.  Negative for headaches.  Endo/Heme/Allergies: Negative.   Psychiatric/Behavioral: Negative.     Family history- Review and unchanged Social history- Review and unchanged Physical Exam: BP 118/62 mmHg  Pulse 84  Temp(Src) 98.1 F (36.7 C)  Resp 16  Ht 5' 3.5" (1.613 m)  Wt 237 lb 6.4 oz (107.684 kg)  BMI 41.39 kg/m2  LMP 06/07/2015 Wt Readings from Last 3 Encounters:  07/10/15 237 lb 6.4 oz (107.684 kg)  06/29/15 228 lb (103.42 kg)  01/07/15 231 lb (104.781 kg)   General Appearance: Well nourished, in no apparent distress. Eyes: PERRLA, EOMs, conjunctiva no swelling or erythema Sinuses: No Frontal/maxillary tenderness ENT/Mouth: Ext aud canals clear, TMs without erythema, bulging. No erythema, swelling, or exudate on post pharynx.  Tonsils not swollen or erythematous. Hearing normal.  Neck: Supple, thyroid normal.  Respiratory: Respiratory effort normal, BS equal bilaterally without rales, rhonchi, wheezing or stridor.  Cardio: RRR with no MRGs. Brisk peripheral pulses without edema.  Abdomen: Soft, + BS,  Non tender, no guarding, rebound, hernias, masses. Lymphatics: Non tender without lymphadenopathy.  Musculoskeletal: Full ROM, 5/5 strength, Normal  gait Skin: Warm, dry without rashes, lesions, ecchymosis.  Neuro: Cranial nerves intact. Normal muscle tone, no cerebellar symptoms. Psych: Awake and oriented X 3, normal affect, Insight and Judgment appropriate.    Quentin Mulling, PA-C 10:42 AM [facility]

## 2015-07-10 NOTE — Patient Instructions (Signed)
Your ears and sinuses are connected by the eustachian tube. When your sinuses are inflamed, this can close off the tube and cause fluid to collect in your middle ear. This can then cause dizziness, popping, clicking, ringing, and echoing in your ears. This is often NOT an infection and does NOT require antibiotics, it is caused by inflammation so the treatments help the inflammation. This can take a long time to get better so please be patient.  Here are things you can do to help with this: - Try the Flonase or Nasonex. Remember to spray each nostril twice towards the outer part of your eye.  Do not sniff but instead pinch your nose and tilt your head back to help the medicine get into your sinuses.  The best time to do this is at bedtime.Stop if you get blurred vision or nose bleeds.  -While drinking fluids, pinch and hold nose close and swallow, to help open eustachian tubes to drain fluid behind ear drums. -Please pick one of the over the counter allergy medications below and take it once daily for allergies.  It will also help with fluid behind ear drums. Claritin or loratadine cheapest but likely the weakest  Zyrtec or certizine at night because it can make you sleepy The strongest is allegra or fexafinadine  Cheapest at [employer], [employer], [employer] -can use decongestant over the counter, please do not use if you have high blood pressure or certain heart conditions.   if worsening HA, changes vision/speech, imbalance, weakness go to the ER   

## 2015-07-11 ENCOUNTER — Other Ambulatory Visit: Payer: Self-pay | Admitting: Obstetrics and Gynecology

## 2015-07-11 ENCOUNTER — Ambulatory Visit
Admission: RE | Admit: 2015-07-11 | Discharge: 2015-07-11 | Disposition: A | Discharge status: Home / Self Care | Source: Ambulatory Visit | Attending: Obstetrics and Gynecology | Admitting: Obstetrics and Gynecology

## 2015-07-11 DIAGNOSIS — R928 Other abnormal and inconclusive findings on diagnostic imaging of breast: Secondary | ICD-10-CM

## 2015-07-11 LAB — VITAMIN D 25 HYDROXY (VIT D DEFICIENCY, FRACTURES): VIT D 25 HYDROXY: 39 ng/mL (ref 30–100)

## 2015-07-11 LAB — HEMOGLOBIN A1C
Hgb A1c MFr Bld: 5.7 % — ABNORMAL HIGH (ref <5.7)
Mean Plasma Glucose: 117 mg/dL — ABNORMAL HIGH (ref <117)

## 2015-07-15 ENCOUNTER — Ambulatory Visit
Admission: RE | Admit: 2015-07-15 | Discharge: 2015-07-15 | Disposition: A | Discharge status: Home / Self Care | Source: Ambulatory Visit | Attending: Obstetrics and Gynecology | Admitting: Obstetrics and Gynecology

## 2015-07-15 DIAGNOSIS — R928 Other abnormal and inconclusive findings on diagnostic imaging of breast: Secondary | ICD-10-CM

## 2015-07-23 ENCOUNTER — Inpatient Hospital Stay: Admission: RE | Admit: 2015-07-23 | Source: Ambulatory Visit

## 2015-08-26 ENCOUNTER — Encounter: Payer: Self-pay | Admitting: Physician Assistant

## 2015-09-09 ENCOUNTER — Encounter: Payer: Self-pay | Admitting: Physician Assistant

## 2015-09-09 ENCOUNTER — Other Ambulatory Visit: Payer: Self-pay | Admitting: Physician Assistant

## 2015-09-09 DIAGNOSIS — F32A Depression, unspecified: Secondary | ICD-10-CM

## 2015-09-09 DIAGNOSIS — F329 Major depressive disorder, single episode, unspecified: Secondary | ICD-10-CM

## 2015-09-09 MED ORDER — ESCITALOPRAM OXALATE 20 MG PO TABS: 20.0000 mg | ORAL_TABLET | Freq: Every day | ORAL | Status: DC

## 2015-09-09 MED ORDER — ALPRAZOLAM 1 MG PO TABS: ORAL_TABLET | ORAL | Status: DC

## 2015-09-18 ENCOUNTER — Ambulatory Visit (INDEPENDENT_AMBULATORY_CARE_PROVIDER_SITE_OTHER): Admitting: Internal Medicine

## 2015-09-18 ENCOUNTER — Encounter: Payer: Self-pay | Admitting: Internal Medicine

## 2015-09-18 VITALS — Temp 97.8°F | Resp 18 | Ht 63.5 in | Wt 234.0 lb

## 2015-09-18 DIAGNOSIS — R21 Rash and other nonspecific skin eruption: Secondary | ICD-10-CM

## 2015-09-18 MED ORDER — DEXAMETHASONE SODIUM PHOSPHATE 100 MG/10ML IJ SOLN
10.0000 mg | Freq: Once | INTRAMUSCULAR | Status: AC
  Administered 2015-09-18: 10 mg via INTRAMUSCULAR

## 2015-09-18 MED ORDER — TRIAMCINOLONE ACETONIDE 0.1 % EX CREA: 1.0000 "application " | TOPICAL_CREAM | Freq: Four times a day (QID) | CUTANEOUS | Status: DC

## 2015-09-18 NOTE — Progress Notes (Signed)
   Subjective:    Patient ID: Bridget Watson, female    DOB: 04-Mar-1971, 44 y.o.   MRN: [id number]  HPI  Patient presents to the office for evaluation of rash which has been going on x 1 day.  It is on bilateral elbows.  She reports that it is extremely itchy.  She hasn't put anything on the rash.  She reports that she has had no changes in her personal products.  She reports that she thinks that somebody in her mothers nursing home has scabies. She hasn't been exposed to the nursing home since last Sunday.  Her mother does not have a rash.  She has not put anything on her rash.   Review of Systems  Constitutional: Negative for fever and chills.  Gastrointestinal: Negative for nausea and vomiting.  Skin: Positive for rash.       Objective:   Physical Exam  Constitutional: She is oriented to person, place, and time. She appears well-developed and well-nourished. No distress.  HENT:  Head: Normocephalic.  Mouth/Throat: Oropharynx is clear and moist. No oropharyngeal exudate.  Eyes: Conjunctivae are normal. No scleral icterus.  Neck: Normal range of motion. Neck supple. No JVD present. No thyromegaly present.  Cardiovascular: Normal rate, regular rhythm, normal heart sounds and intact distal pulses.  Exam reveals no gallop and no friction rub.   No murmur heard. Pulmonary/Chest: Effort normal and breath sounds normal. No respiratory distress. She has no wheezes. She has no rales. She exhibits no tenderness.  Lymphadenopathy:    She has no cervical adenopathy.  Neurological: She is alert and oriented to person, place, and time.  Skin: Rash noted. Rash is urticarial. She is not diaphoretic.     Nursing note and vitals reviewed.   Filed Vitals:   09/18/15 1512  Temp: 97.8 F (36.6 C)  Resp: 18          Assessment & Plan:    1. Rash and nonspecific skin eruption -kenalog -benadryl - dexamethasone (DECADRON) injection 10 mg; Inject 1 mL (10 mg total) into the muscle  once.

## 2015-12-16 ENCOUNTER — Encounter: Payer: Self-pay | Admitting: Physician Assistant

## 2015-12-18 ENCOUNTER — Encounter: Payer: Self-pay | Admitting: Physician Assistant

## 2015-12-27 ENCOUNTER — Ambulatory Visit (INDEPENDENT_AMBULATORY_CARE_PROVIDER_SITE_OTHER): Admitting: Internal Medicine

## 2015-12-27 ENCOUNTER — Encounter: Payer: Self-pay | Admitting: Internal Medicine

## 2015-12-27 VITALS — BP 118/70 | HR 104 | Temp 97.8°F | Resp 18 | Ht 63.5 in | Wt 245.0 lb

## 2015-12-27 DIAGNOSIS — J069 Acute upper respiratory infection, unspecified: Secondary | ICD-10-CM | POA: Diagnosis not present

## 2015-12-27 MED ORDER — BENZONATATE 200 MG PO CAPS: 200.0000 mg | ORAL_CAPSULE | Freq: Three times a day (TID) | ORAL | Status: DC | PRN

## 2015-12-27 MED ORDER — ALPRAZOLAM 1 MG PO TABS: ORAL_TABLET | ORAL | Status: DC

## 2015-12-27 MED ORDER — FLUTICASONE PROPIONATE 50 MCG/ACT NA SUSP: 2.0000 | Freq: Every day | NASAL | Status: DC

## 2015-12-27 MED ORDER — AZITHROMYCIN 250 MG PO TABS: ORAL_TABLET | ORAL | Status: DC

## 2015-12-27 MED ORDER — CETIRIZINE HCL 10 MG PO TABS: 10.0000 mg | ORAL_TABLET | Freq: Every day | ORAL | Status: DC

## 2015-12-27 NOTE — Progress Notes (Signed)
  HPI  Patient presents to the office for evaluation of cough, chest pain, sinus pressure and ear pain.  It has been going on for 2 days.  Patient reports night > day, wet, worse with lying down.  They also endorse change in voice, fever, postnasal drip, shortness of breath, wheezing and sinus pressure, nasal congestion, bilateral ear pain, headaches.  .  They have tried dayquil.  They report that nothing has worked.  They admits to other sick contacts.  Her son had some cough and diarrhea.    Review of Systems  Constitutional: Positive for fever, chills and malaise/fatigue.  HENT: Positive for congestion, ear pain and sore throat.   Respiratory: Positive for cough, shortness of breath and wheezing. Negative for sputum production.   Cardiovascular: Positive for chest pain (soreness with deep breathing). Negative for palpitations and leg swelling.  Neurological: Positive for headaches.    PE:  Filed Vitals:   12/27/15 1118  BP: 118/70  Pulse: 104  Temp: 97.8 F (36.6 C)  Resp: 18   General:  Alert and non-toxic, WDWN, NAD HEENT: NCAT, PERLA, EOM normal, no occular discharge or erythema.  Nasal mucosal edema with sinus tenderness to palpation.  Oropharynx clear with minimal oropharyngeal edema and erythema.  Mucous membranes moist and pink. Neck:  Cervical adenopathy Chest:  RRR no MRGs.  Lungs clear to auscultation A&P with no wheezes rhonchi or rales.   Abdomen: +BS x 4 quadrants, soft, non-tender, no guarding, rigidity, or rebound. Skin: warm and dry no rash Neuro: A&Ox4, CN II-XII grossly intact  Assessment and Plan:   1. Acute URI -nasal saline -mucinex prn - azithromycin (ZITHROMAX Z-PAK) 250 MG tablet; 2 po day one, then 1 daily x 4 days  Dispense: 6 tablet; Refill: 0 - benzonatate (TESSALON) 200 MG capsule; Take 1 capsule (200 mg total) by mouth 3 (three) times daily as needed for cough.  Dispense: 20 capsule; Refill: 0 - fluticasone (FLONASE) 50 MCG/ACT nasal spray; Place  2 sprays into both nostrils daily.  Dispense: 16 g; Refill: 0 - cetirizine (ZYRTEC) 10 MG tablet; Take 1 tablet (10 mg total) by mouth daily.  Dispense: 90 tablet; Refill: PRN

## 2016-01-09 ENCOUNTER — Encounter: Payer: Self-pay | Admitting: Physician Assistant

## 2016-02-11 ENCOUNTER — Encounter: Payer: Self-pay | Admitting: Physician Assistant

## 2016-02-11 ENCOUNTER — Ambulatory Visit (INDEPENDENT_AMBULATORY_CARE_PROVIDER_SITE_OTHER): Payer: BLUE CROSS/BLUE SHIELD | Admitting: Physician Assistant

## 2016-02-11 VITALS — BP 126/74 | HR 86 | Temp 97.5°F | Resp 16 | Ht 63.5 in | Wt 249.6 lb

## 2016-02-11 DIAGNOSIS — K2100 Gastro-esophageal reflux disease with esophagitis, without bleeding: Secondary | ICD-10-CM

## 2016-02-11 DIAGNOSIS — Z136 Encounter for screening for cardiovascular disorders: Secondary | ICD-10-CM

## 2016-02-11 DIAGNOSIS — F3341 Major depressive disorder, recurrent, in partial remission: Secondary | ICD-10-CM

## 2016-02-11 DIAGNOSIS — F32A Depression, unspecified: Secondary | ICD-10-CM

## 2016-02-11 DIAGNOSIS — E669 Obesity, unspecified: Secondary | ICD-10-CM | POA: Diagnosis not present

## 2016-02-11 DIAGNOSIS — F909 Attention-deficit hyperactivity disorder, unspecified type: Secondary | ICD-10-CM | POA: Diagnosis not present

## 2016-02-11 DIAGNOSIS — Z79899 Other long term (current) drug therapy: Secondary | ICD-10-CM | POA: Diagnosis not present

## 2016-02-11 DIAGNOSIS — R6889 Other general symptoms and signs: Secondary | ICD-10-CM | POA: Diagnosis not present

## 2016-02-11 DIAGNOSIS — B360 Pityriasis versicolor: Secondary | ICD-10-CM

## 2016-02-11 DIAGNOSIS — E559 Vitamin D deficiency, unspecified: Secondary | ICD-10-CM | POA: Diagnosis not present

## 2016-02-11 DIAGNOSIS — E785 Hyperlipidemia, unspecified: Secondary | ICD-10-CM | POA: Diagnosis not present

## 2016-02-11 DIAGNOSIS — G6289 Other specified polyneuropathies: Secondary | ICD-10-CM | POA: Diagnosis not present

## 2016-02-11 DIAGNOSIS — J45909 Unspecified asthma, uncomplicated: Secondary | ICD-10-CM

## 2016-02-11 DIAGNOSIS — F064 Anxiety disorder due to known physiological condition: Secondary | ICD-10-CM

## 2016-02-11 DIAGNOSIS — Z0001 Encounter for general adult medical examination with abnormal findings: Secondary | ICD-10-CM

## 2016-02-11 DIAGNOSIS — R7303 Prediabetes: Secondary | ICD-10-CM

## 2016-02-11 DIAGNOSIS — I10 Essential (primary) hypertension: Secondary | ICD-10-CM

## 2016-02-11 DIAGNOSIS — F410 Panic disorder [episodic paroxysmal anxiety] without agoraphobia: Secondary | ICD-10-CM

## 2016-02-11 DIAGNOSIS — F329 Major depressive disorder, single episode, unspecified: Secondary | ICD-10-CM

## 2016-02-11 DIAGNOSIS — Z9884 Bariatric surgery status: Secondary | ICD-10-CM

## 2016-02-11 LAB — BASIC METABOLIC PANEL WITH GFR
BUN: 12 mg/dL (ref 7–25)
CHLORIDE: 107 mmol/L (ref 98–110)
CO2: 26 mmol/L (ref 20–31)
CREATININE: 0.8 mg/dL (ref 0.50–1.10)
Calcium: 9.2 mg/dL (ref 8.6–10.2)
GFR, Est African American: >89 mL/min (ref >=60)
GFR, Est Non African American: >89 mL/min (ref >=60)
GLUCOSE: 91 mg/dL (ref 65–99)
Potassium: 4.4 mmol/L (ref 3.5–5.3)
Sodium: 141 mmol/L (ref 135–146)

## 2016-02-11 LAB — CBC WITH DIFFERENTIAL/PLATELET
BASOS ABS: 0 {cells}/uL (ref 0–200)
BASOS PCT: 0 %
EOS ABS: 124 {cells}/uL (ref 15–500)
Eosinophils Relative: 2 %
HEMATOCRIT: 37.4 % (ref 35.0–45.0)
Hemoglobin: 12.4 g/dL (ref 11.7–15.5)
LYMPHS PCT: 44 %
Lymphs Abs: 2728 cells/uL (ref 850–3900)
MCH: 27.6 pg (ref 27.0–33.0)
MCHC: 33.2 g/dL (ref 32.0–36.0)
MCV: 83.1 fL (ref 80.0–100.0)
MONO ABS: 310 {cells}/uL (ref 200–950)
MPV: 10.9 fL (ref 7.5–12.5)
Monocytes Relative: 5 %
Neutro Abs: 3038 cells/uL (ref 1500–7800)
Neutrophils Relative %: 49 %
Platelets: 297 10*3/uL (ref 140–400)
RBC: 4.5 MIL/uL (ref 3.80–5.10)
RDW: 14.3 % (ref 11.0–15.0)
WBC: 6.2 10*3/uL (ref 3.8–10.8)

## 2016-02-11 LAB — HEPATIC FUNCTION PANEL
ALBUMIN: 3.9 g/dL (ref 3.6–5.1)
ALT: 16 U/L (ref 6–29)
AST: 19 U/L (ref 10–30)
Alkaline Phosphatase: 32 U/L — ABNORMAL LOW (ref 33–115)
BILIRUBIN TOTAL: 0.3 mg/dL (ref 0.2–1.2)
Bilirubin, Direct: <0.1 mg/dL (ref <=0.2)
TOTAL PROTEIN: 6.3 g/dL (ref 6.1–8.1)

## 2016-02-11 LAB — IRON AND TIBC
%SAT: 31 % (ref 11–50)
Iron: 83 ug/dL (ref 40–190)
TIBC: 266 ug/dL (ref 250–450)
UIBC: 183 ug/dL (ref 125–400)

## 2016-02-11 LAB — VITAMIN B12: VITAMIN B 12: 1033 pg/mL (ref 200–1100)

## 2016-02-11 LAB — LIPID PANEL
Cholesterol: 179 mg/dL (ref 125–200)
HDL: 47 mg/dL (ref >=46)
LDL Cholesterol: 108 mg/dL (ref <130)
Total CHOL/HDL Ratio: 3.8 Ratio (ref <=5.0)
Triglycerides: 120 mg/dL (ref <150)
VLDL: 24 mg/dL (ref <30)

## 2016-02-11 LAB — HEMOGLOBIN A1C
HEMOGLOBIN A1C: 5.8 % — AB (ref <5.7)
MEAN PLASMA GLUCOSE: 120 mg/dL

## 2016-02-11 LAB — MAGNESIUM: Magnesium: 2.1 mg/dL (ref 1.5–2.5)

## 2016-02-11 LAB — TSH: TSH: 0.57 m[IU]/L

## 2016-02-11 LAB — FERRITIN: Ferritin: 73 ng/mL (ref 10–232)

## 2016-02-11 MED ORDER — ESCITALOPRAM OXALATE 20 MG PO TABS: 20.0000 mg | ORAL_TABLET | Freq: Every day | ORAL | Status: DC

## 2016-02-11 MED ORDER — CICLOPIROX 1 % EX SHAM: MEDICATED_SHAMPOO | CUTANEOUS | Status: DC

## 2016-02-11 MED ORDER — MOMETASONE FURO-FORMOTEROL FUM 100-5 MCG/ACT IN AERO: 2.0000 | INHALATION_SPRAY | Freq: Two times a day (BID) | RESPIRATORY_TRACT | Status: DC

## 2016-02-11 MED ORDER — BUPROPION HCL ER (XL) 150 MG PO TB24: 150.0000 mg | ORAL_TABLET | ORAL | Status: DC

## 2016-02-11 MED ORDER — TRAZODONE HCL 150 MG PO TABS: 150.0000 mg | ORAL_TABLET | Freq: Every day | ORAL | Status: DC

## 2016-02-11 MED ORDER — ALBUTEROL SULFATE HFA 108 (90 BASE) MCG/ACT IN AERS: 2.0000 | INHALATION_SPRAY | RESPIRATORY_TRACT | Status: DC | PRN

## 2016-02-11 NOTE — Patient Instructions (Signed)
Preventive Care for Adults  A healthy lifestyle and preventive care can promote health and wellness. Preventive health guidelines for women include the following key practices.  A routine yearly physical is a good way to check with your health care provider about your health and preventive screening. It is a chance to share any concerns and updates on your health and to receive a thorough exam.  Visit your dentist for a routine exam and preventive care every 6 months. Brush your teeth twice a day and floss once a day. Good oral hygiene prevents tooth decay and gum disease.  The frequency of eye exams is based on your age, health, family medical history, use of contact lenses, and other factors. Follow your health care provider's recommendations for frequency of eye exams.  Eat a healthy diet. Foods like vegetables, fruits, whole grains, low-fat dairy products, and lean protein foods contain the nutrients you need without too many calories. Decrease your intake of foods high in solid fats, added sugars, and salt. Eat the right amount of calories for you.Get information about a proper diet from your health care provider, if necessary.  Regular physical exercise is one of the most important things you can do for your health. Most adults should get at least 150 minutes of moderate-intensity exercise (any activity that increases your heart rate and causes you to sweat) each week. In addition, most adults need muscle-strengthening exercises on 2 or more days a week.  Maintain a healthy weight. The body mass index (BMI) is a screening tool to identify possible weight problems. It provides an estimate of body fat based on height and weight. Your health care provider can find your BMI and can help you achieve or maintain a healthy weight.For adults 20 years and older:  A BMI below 18.5 is considered underweight.  A BMI of 18.5 to 24.9 is normal.  A BMI of 25 to 29.9 is considered overweight.  A BMI of  30 and above is considered obese.  Maintain normal blood lipids and cholesterol levels by exercising and minimizing your intake of saturated fat. Eat a balanced diet with plenty of fruit and vegetables. Blood tests for lipids and cholesterol should begin at age 20 and be repeated every 5 years. If your lipid or cholesterol levels are high, you are over 50, or you are at high risk for heart disease, you may need your cholesterol levels checked more frequently.Ongoing high lipid and cholesterol levels should be treated with medicines if diet and exercise are not working.  If you smoke, find out from your health care provider how to quit. If you do not use tobacco, do not start.  Lung cancer screening is recommended for adults aged 55-80 years who are at high risk for developing lung cancer because of a history of smoking. A yearly low-dose CT scan of the lungs is recommended for people who have at least a 30-pack-year history of smoking and are a current smoker or have quit within the past 15 years. A pack year of smoking is smoking an average of 1 pack of cigarettes a day for 1 year (for example: 1 pack a day for 30 years or 2 packs a day for 15 years). Yearly screening should continue until the smoker has stopped smoking for at least 15 years. Yearly screening should be stopped for people who develop a health problem that would prevent them from having lung cancer treatment.  High blood pressure causes heart disease and increases the risk of   stroke. Your blood pressure should be checked at least every 1 to 2 years. Ongoing high blood pressure should be treated with medicines if weight loss and exercise do not work.  If you are 55-79 years old, ask your health care provider if you should take aspirin to prevent strokes.  Diabetes screening involves taking a blood sample to check your fasting blood sugar level. This should be done once every 3 years, after age 45, if you are within normal weight and  without risk factors for diabetes. Testing should be considered at a younger age or be carried out more frequently if you are overweight and have at least 1 risk factor for diabetes.  Breast cancer screening is essential preventive care for women. You should practice "breast self-awareness." This means understanding the normal appearance and feel of your breasts and may include breast self-examination. Any changes detected, no matter how small, should be reported to a health care provider. Women in their 20s and 30s should have a clinical breast exam (CBE) by a health care provider as part of a regular health exam every 1 to 3 years. After age 40, women should have a CBE every year. Starting at age 40, women should consider having a mammogram (breast X-ray test) every year. Women who have a family history of breast cancer should talk to their health care provider about genetic screening. Women at a high risk of breast cancer should talk to their health care providers about having an MRI and a mammogram every year.  Breast cancer gene (BRCA)-related cancer risk assessment is recommended for women who have family members with BRCA-related cancers. BRCA-related cancers include breast, ovarian, tubal, and peritoneal cancers. Having family members with these cancers may be associated with an increased risk for harmful changes (mutations) in the breast cancer genes BRCA1 and BRCA2. Results of the assessment will determine the need for genetic counseling and BRCA1 and BRCA2 testing.  Routine pelvic exams to screen for cancer are no longer recommended for nonpregnant women who are considered low risk for cancer of the pelvic organs (ovaries, uterus, and vagina) and who do not have symptoms. Ask your health care provider if a screening pelvic exam is right for you.  If you have had past treatment for cervical cancer or a condition that could lead to cancer, you need Pap tests and screening for cancer for at least 20  years after your treatment. If Pap tests have been discontinued, your risk factors (such as having a new sexual partner) need to be reassessed to determine if screening should be resumed. Some women have medical problems that increase the chance of getting cervical cancer. In these cases, your health care provider may recommend more frequent screening and Pap tests.  Colorectal cancer can be detected and often prevented. Most routine colorectal cancer screening begins at the age of 50 years and continues through age 75 years. However, your health care provider may recommend screening at an earlier age if you have risk factors for colon cancer. On a yearly basis, your health care provider may provide home test kits to check for hidden blood in the stool. Use of a small camera at the end of a tube, to directly examine the colon (sigmoidoscopy or colonoscopy), can detect the earliest forms of colorectal cancer. Talk to your health care provider about this at age 50, when routine screening begins. Direct exam of the colon should be repeated every 5-10 years through age 75 years, unless early forms of pre-cancerous   polyps or small growths are found.  Hepatitis C blood testing is recommended for all people born from 1945 through 1965 and any individual with known risks for hepatitis C.  Pra  Osteoporosis is a disease in which the bones lose minerals and strength with aging. This can result in serious bone fractures or breaks. The risk of osteoporosis can be identified using a bone density scan. Women ages 65 years and over and women at risk for fractures or osteoporosis should discuss screening with their health care providers. Ask your health care provider whether you should take a calcium supplement or vitamin D to reduce the rate of osteoporosis.  Menopause can be associated with physical symptoms and risks. Hormone replacement therapy is available to decrease symptoms and risks. You should talk to your  health care provider about whether hormone replacement therapy is right for you.  Use sunscreen. Apply sunscreen liberally and repeatedly throughout the day. You should seek shade when your shadow is shorter than you. Protect yourself by wearing long sleeves, pants, a wide-brimmed hat, and sunglasses year round, whenever you are outdoors.  Once a month, do a whole body skin exam, using a mirror to look at the skin on your back. Tell your health care provider of new moles, moles that have irregular borders, moles that are larger than a pencil eraser, or moles that have changed in shape or color.  Stay current with required vaccines (immunizations).  Influenza vaccine. All adults should be immunized every year.  Tetanus, diphtheria, and acellular pertussis (Td, Tdap) vaccine. Pregnant women should receive 1 dose of Tdap vaccine during each pregnancy. The dose should be obtained regardless of the length of time since the last dose. Immunization is preferred during the 27th-36th week of gestation. An adult who has not previously received Tdap or who does not know her vaccine status should receive 1 dose of Tdap. This initial dose should be followed by tetanus and diphtheria toxoids (Td) booster doses every 10 years. Adults with an unknown or incomplete history of completing a 3-dose immunization series with Td-containing vaccines should begin or complete a primary immunization series including a Tdap dose. Adults should receive a Td booster every 10 years.  Varicella vaccine. An adult without evidence of immunity to varicella should receive 2 doses or a second dose if she has previously received 1 dose. Pregnant females who do not have evidence of immunity should receive the first dose after pregnancy. This first dose should be obtained before leaving the health care facility. The second dose should be obtained 4-8 weeks after the first dose.  Human papillomavirus (HPV) vaccine. Females aged 13-26 years  who have not received the vaccine previously should obtain the 3-dose series. The vaccine is not recommended for use in pregnant females. However, pregnancy testing is not needed before receiving a dose. If a female is found to be pregnant after receiving a dose, no treatment is needed. In that case, the remaining doses should be delayed until after the pregnancy. Immunization is recommended for any person with an immunocompromised condition through the age of 26 years if she did not get any or all doses earlier. During the 3-dose series, the second dose should be obtained 4-8 weeks after the first dose. The third dose should be obtained 24 weeks after the first dose and 16 weeks after the second dose.  Zoster vaccine. One dose is recommended for adults aged 60 years or older unless certain conditions are present.  Measles, mumps, and rubella (  MMR) vaccine. Adults born before [birth year] generally are considered immune to measles and mumps. Adults born in [birth year] or later should have 1 or more doses of MMR vaccine unless there is a contraindication to the vaccine or there is laboratory evidence of immunity to each of the three diseases. A routine second dose of MMR vaccine should be obtained at least 28 days after the first dose for students attending postsecondary schools, health care workers, or international travelers. People who received inactivated measles vaccine or an unknown type of measles vaccine during 1963-1967 should receive 2 doses of MMR vaccine. People who received inactivated mumps vaccine or an unknown type of mumps vaccine before 1979 and are at high risk for mumps infection should consider immunization with 2 doses of MMR vaccine. For females of childbearing age, rubella immunity should be determined. If there is no evidence of immunity, females who are not pregnant should be vaccinated. If there is no evidence of immunity, females who are pregnant should delay immunization until after pregnancy.  Unvaccinated health care workers born before [birth year] who lack laboratory evidence of measles, mumps, or rubella immunity or laboratory confirmation of disease should consider measles and mumps immunization with 2 doses of MMR vaccine or rubella immunization with 1 dose of MMR vaccine.  Pneumococcal 13-valent conjugate (PCV13) vaccine. When indicated, a person who is uncertain of her immunization history and has no record of immunization should receive the PCV13 vaccine. An adult aged 39 years or older who has certain medical conditions and has not been previously immunized should receive 1 dose of PCV13 vaccine. This PCV13 should be followed with a dose of pneumococcal polysaccharide (PPSV23) vaccine. The PPSV23 vaccine dose should be obtained at least 8 weeks after the dose of PCV13 vaccine. An adult aged 62 years or older who has certain medical conditions and previously received 1 or more doses of PPSV23 vaccine should receive 1 dose of PCV13. The PCV13 vaccine dose should be obtained 1 or more years after the last PPSV23 vaccine dose.    Pneumococcal polysaccharide (PPSV23) vaccine. When PCV13 is also indicated, PCV13 should be obtained first. All adults aged 67 years and older should be immunized. An adult younger than age 45 years who has certain medical conditions should be immunized. Any person who resides in a nursing home or long-term care facility should be immunized. An adult smoker should be immunized. People with an immunocompromised condition and certain other conditions should receive both PCV13 and PPSV23 vaccines. People with human immunodeficiency virus (HIV) infection should be immunized as soon as possible after diagnosis. Immunization during chemotherapy or radiation therapy should be avoided. Routine use of PPSV23 vaccine is not recommended for American Indians, [place] Natives, or people younger than 65 years unless there are medical conditions that require PPSV23 vaccine. When indicated,  people who have unknown immunization and have no record of immunization should receive PPSV23 vaccine. One-time revaccination 5 years after the first dose of PPSV23 is recommended for people aged 19-64 years who have chronic kidney failure, nephrotic syndrome, asplenia, or immunocompromised conditions. People who received 1-2 doses of PPSV23 before age 23 years should receive another dose of PPSV23 vaccine at age 35 years or later if at least 5 years have passed since the previous dose. Doses of PPSV23 are not needed for people immunized with PPSV23 at or after age 38 years.  Preventive Services / Frequency   Ages 43 to 86 years  Blood pressure check.  Lipid and cholesterol check.  Lung  cancer screening. / Every year if you are aged 55-80 years and have a 30-pack-year history of smoking and currently smoke or have quit within the past 15 years. Yearly screening is stopped once you have quit smoking for at least 15 years or develop a health problem that would prevent you from having lung cancer treatment.  Clinical breast exam.** / Every year after age 51 years.  BRCA-related cancer risk assessment.** / For women who have family members with a BRCA-related cancer (breast, ovarian, tubal, or peritoneal cancers).  Mammogram.** / Every year beginning at age 81 years and continuing for as long as you are in good health. Consult with your health care provider.  Pap test.** / Every 3 years starting at age 74 years through age 88 or 32 years with a history of 3 consecutive normal Pap tests.  HPV screening.** / Every 3 years from ages 85 years through ages 38 to 36 years with a history of 3 consecutive normal Pap tests.  Fecal occult blood test (FOBT) of stool. / Every year beginning at age 16 years and continuing until age 29 years. You may not need to do this test if you get a colonoscopy every 10 years.  Flexible sigmoidoscopy or colonoscopy.** / Every 5 years for a flexible sigmoidoscopy or  every 10 years for a colonoscopy beginning at age 34 years and continuing until age 23 years.  Hepatitis C blood test.** / For all people born from [birth year] through 1965 and any individual with known risks for hepatitis C.  Skin self-exam. / Monthly.  Influenza vaccine. / Every year.  Tetanus, diphtheria, and acellular pertussis (Tdap/Td) vaccine.** / Consult your health care provider. Pregnant women should receive 1 dose of Tdap vaccine during each pregnancy. 1 dose of Td every 10 years.  Varicella vaccine.** / Consult your health care provider. Pregnant females who do not have evidence of immunity should receive the first dose after pregnancy.  Zoster vaccine.** / 1 dose for adults aged 15 years or older.  Pneumococcal 13-valent conjugate (PCV13) vaccine.** / Consult your health care provider.  Pneumococcal polysaccharide (PPSV23) vaccine.** / 1 to 2 doses if you smoke cigarettes or if you have certain conditions.  Meningococcal vaccine.** / Consult your health care provider.  Hepatitis A vaccine.** / Consult your health care provider.  Hepatitis B vaccine.** / Consult your health care provider. Screening for abdominal aortic aneurysm (AAA)  by ultrasound is recommended for people over 50 who have history of high blood pressure or who are current or former smokers.  Tinea Versicolor Tinea versicolor is a skin infection that is caused by a type of yeast. It causes a rash that shows up as light or dark patches on the skin. It often occurs on the chest, back, neck, or upper arms. The condition usually does not cause other problems. In most cases, it goes away in a few weeks with treatment. The infection cannot be spread by person to another person. HOME CARE  Take medicines only as told by your doctor.  Scrub your skin every day with a dandruff shampoo as told by your doctor.  Do not scratch your skin in the rash area.  Avoid places that are hot and humid.  Do not use tanning  booths.  Try to avoid sweating a lot. GET HELP IF:  Your symptoms get worse.  You have a fever.  You have redness, swelling, or pain in the area of your rash.  You have fluid, blood, or pus coming  from your rash.  Your rash comes back after treatment.   This information is not intended to replace advice given to you by your health care provider. Make sure you discuss any questions you have with your health care provider.   Document Released: 09/17/2008 Document Revised: 10/26/2014 Document Reviewed: 07/17/2014 [employer] Interactive Patient Education [employer].

## 2016-02-11 NOTE — Progress Notes (Signed)
Complete Physical  Assessment and Plan: 1. Essential hypertension - continue medications, DASH diet, exercise and monitor at home. Call if greater than 130/80.   2. Hyperlipidemia -continue medications, check lipids, decrease fatty foods, increase activity.   3. Obesity  long discussion about weight loss, diet, and exercise, continue weight loss after bypass  4. Pre-diabetes Better with weight loss  5. Asthma, unspecified asthma severity, uncomplicated controlled  6. Gastroesophageal reflux disease with esophagitis Continue PPI/H2 blocker, diet discussed  7. Peripheral neuropathy better  8. Allergy, subsequent encounter Continue OTC allergy pills  9. Depression - buPROPion (WELLBUTRIN XL) 300 MG 24 hr tablet; Take 1 tablet (300 mg total) by mouth every morning.  Dispense: 90 tablet; Refill: 2 - escitalopram (LEXAPRO) 20 MG tablet; Take 1 tablet (20 mg total) by mouth daily.  Dispense: 90 tablet; Refill: 1 - traZODone (DESYREL) 150 MG tablet; Take 1 tablet (150 mg total) by mouth daily with breakfast.  Dispense: 90 tablet; Refill: 1  10. ADD (attention deficit disorder) Continue follow up Dr. Elisabeth Most  11. S/P gastric bypass Down 100 lbs Check labs  12. Family history of breast cancer Get MGM, over due, negative genetic testing  13. Hypokalemia - Magnesium - BASIC METABOLIC PANEL WITH GFR List of healthy foods given  14. Insomnia Trazodone  15. Tinea versicolor Treatment sent in   Discussed med's effects and SE's. Screening labs and tests as requested with regular follow-up as recommended.  HPI  45 y.o. female  presents for a complete physical.  Her blood pressure has been controlled at home, today their BP is BP: 126/74 mmHg She does workout, 4-5 days a week at the gym. She denies chest pain, shortness of breath, dizziness.  She had gastric bypass July 17th 2015 with [facility], she has since lost 102 lbs since that time and she is off CPAP, and off  her MF since that time. Weight has been up do to "life happening" and interferring with working out.  Wt Readings from Last 3 Encounters:  02/11/16 249 lb 9.6 oz (113.218 kg)  12/27/15 245 lb (111.131 kg)  09/18/15 234 lb (106.142 kg)   She is not on cholesterol medication and denies myalgias. Her cholesterol is at goal. The cholesterol last visit was:  Lab Results  Component Value Date   CHOL 174 07/10/2015   HDL 51 07/10/2015   LDLCALC 99 07/10/2015   TRIG 119 07/10/2015   CHOLHDL 3.4 07/10/2015   Lab Results  Component Value Date   CREATININE 0.80 07/10/2015   BUN 12 07/10/2015   NA 138 07/10/2015   K 3.9 07/10/2015   CL 105 07/10/2015   CO2 26 07/10/2015   Getting labs at bariatric clinic.   She has been working on diet and exercise for prediabetes,  and denies polydipsia, polyuria and visual disturbances. Lab Results  Component Value Date   HGBA1C 5.7* 07/10/2015   Patient is on Vitamin D supplement but was decreased due to elevation.  Was 129 at bariatric clinic.   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  She follows with Dr. Elisabeth Most for her ADD.  She has depression, on lexapro and was on wellbutrin, has been of wellbutrin and recently has noticed a decrease in motivation and would like to get back on it. She uses trazodone for sleep.   Current Medications:  Current Outpatient Prescriptions on File Prior to Visit  Medication Sig Dispense Refill  . ALPRAZolam (XANAX) 1 MG tablet take 1 tablet by mouth  three times a day if needed 90 tablet 1  . calcium citrate-vitamin D (CITRACAL+D) 315-200 MG-UNIT per tablet Take by mouth.    . cetirizine (ZYRTEC) 10 MG tablet Take 1 tablet (10 mg total) by mouth daily. 90 tablet PRN  . Cyanocobalamin (VITAMIN B-12) 5000 MCG SUBL Place 1 tablet under the tongue daily.    Marland Kitchen escitalopram (LEXAPRO) 20 MG tablet Take 1 tablet (20 mg total) by mouth daily. 90 tablet 1  . fluticasone (FLONASE) 50 MCG/ACT nasal spray  Place 2 sprays into both nostrils daily. 16 g 0  . Methylphenidate HCl ER (QUILLIVANT XR) 25 MG/5ML SUSR 12 ML a day total 180 mL 0  . traZODone (DESYREL) 150 MG tablet Take 1 tablet (150 mg total) by mouth daily with breakfast. 90 tablet 1  . triamcinolone cream (KENALOG) 0.1 % Apply 1 application topically 4 (four) times daily. 30 g 0   No current facility-administered medications on file prior to visit.   Health Maintenance:   Immunization History  Administered Date(s) Administered  . Influenza Split 08/08/2013  . Td 04/11/2003   Tetanus: 2004 Pneumovax: M/A Flu vaccine: 2014 Zostavax: N/A LMP: Dec 17 2014, normal, boyfriend with vasectomy, she is off BCP  Pap: 2013, never abnormal pap, Dr. Garnet Koyanagi MGM: 06/2015 right biopsy DEXA: N/A Colonoscopy: N/A EGD: N/A CXR 11/2013 Ct chest 11/2013 CT AB 06/2000 Stress test 11/2013  Last Dental Exam: No Last Eye Exam:  Yes, unknown name  Patient Care Team: Lucky Cowboy, MD as PCP - General (Internal Medicine)  Allergies:  Allergies  Allergen Reactions  . Keflex [Cephalexin] Swelling and Hives   Medical History:  Past Medical History  Diagnosis Date  . Hyperlipidemia   . Obesity   . Type II or unspecified type diabetes mellitus without mention of complication, not stated as uncontrolled   . Allergy   . GERD (gastroesophageal reflux disease)   . Depression   . ADD (attention deficit disorder)   . Asthma   . Peripheral neuropathy (HCC)   . Anxiety   . Preterm labor   . Gestational diabetes   . Infection     UTI  . Ovarian cyst   . OCD (obsessive compulsive disorder)    Surgical History:  Past Surgical History  Procedure Laterality Date  . Oophorectomy Right 07/2009  . Wisdom tooth extraction    . Gastric bypass  04/2014    [facility]   Family History:  Family History  Problem Relation Age of Onset  . Ovarian cancer Sister 37  . Breast cancer Sister 44  . Prostate cancer Father   . Healthy Sister   . Stomach  cancer Brother 63  . Diabetes Mother   . Heart disease Mother   . Diabetes Son   . Asthma Son    Social History:  Social History  Substance Use Topics  . Smoking status: Former Smoker -- 0.25 packs/day    Types: Cigarettes    Quit date: 03/19/2014  . Smokeless tobacco: Never Used  . Alcohol Use: 2.4 oz/week    4 Glasses of wine per week     Comment: 4-5 glasses of wine per week   Review of Systems: Review of Systems  Constitutional: Negative.   HENT: Positive for congestion. Negative for ear discharge, ear pain, hearing loss, nosebleeds, sore throat and tinnitus.   Eyes: Negative.   Respiratory: Negative for cough, hemoptysis, sputum production, shortness of breath, wheezing and stridor.   Gastrointestinal: Negative.   Genitourinary: Negative.  Musculoskeletal: Negative.   Skin: Positive for rash. Negative for itching.  Neurological: Positive for headaches. Negative for dizziness, tingling, tremors, sensory change, speech change, focal weakness, seizures and loss of consciousness.  Psychiatric/Behavioral: Negative.     Physical Exam: Estimated body mass index is 43.52 kg/(m^2) as calculated from the following:   Height as of this encounter: 5' 3.5" (1.613 m).   Weight as of this encounter: 249 lb 9.6 oz (113.218 kg). BP 126/74 mmHg  Pulse 86  Temp(Src) 97.5 F (36.4 C) (Temporal)  Resp 16  Ht 5' 3.5" (1.613 m)  Wt 249 lb 9.6 oz (113.218 kg)  BMI 43.52 kg/m2  SpO2 96%  LMP 01/28/2016 General Appearance: Well nourished, in no apparent distress.  Eyes: PERRLA, EOMs, conjunctiva no swelling or erythema, normal fundi and vessels.  Sinuses: No Frontal/maxillary tenderness  ENT/Mouth: Ext aud canals clear, normal light reflex with TMs without erythema, bulging. Good dentition. No erythema, swelling, or exudate on post pharynx. Tonsils not swollen or erythematous. Hearing normal.  Neck: Supple, thyroid normal. No bruits  Respiratory: Respiratory effort normal, BS equal  bilaterally without rales, rhonchi, wheezing or stridor.  Cardio: RRR without murmurs, rubs or gallops. Brisk peripheral pulses without edema.  Chest: symmetric, with normal excursions and percussion.  Breasts: defer OB/GYN Abdomen: Soft, nontender, no guarding, rebound, hernias, masses, or organomegaly. .  Lymphatics: Non tender without lymphadenopathy.  Genitourinary: defer OB/GYN Musculoskeletal: Full ROM all peripheral extremities,5/5 strength, and normal gait.  Skin: Velvety stuck on non erythematous patches/plaques darker in color than skin along trunk, and neck.  Warm, dry without rashes, lesions, ecchymosis. Neuro: Cranial nerves intact, reflexes equal bilaterally. Normal muscle tone, no cerebellar symptoms. Sensation intact.  Psych: Awake and oriented X 3, normal affect, Insight and Judgment appropriate.   EKG: wnl AORTA SCAN: N/A  Quentin MullingAmanda Avanelle Pixley 10:23 AM [facility]

## 2016-02-12 LAB — URINALYSIS, ROUTINE W REFLEX MICROSCOPIC
Bilirubin Urine: NEGATIVE
Glucose, UA: NEGATIVE
Hgb urine dipstick: NEGATIVE
KETONES UR: NEGATIVE
LEUKOCYTES UA: NEGATIVE
NITRITE: NEGATIVE
PH: 7 (ref 5.0–8.0)
Protein, ur: NEGATIVE
SPECIFIC GRAVITY, URINE: 1.023 (ref 1.001–1.035)

## 2016-02-12 LAB — MICROALBUMIN / CREATININE URINE RATIO
CREATININE, URINE: 125 mg/dL (ref 20–320)
MICROALB UR: 0.3 mg/dL
MICROALB/CREAT RATIO: 2 ug/mg{creat} (ref <30)

## 2016-02-12 LAB — VITAMIN D 25 HYDROXY (VIT D DEFICIENCY, FRACTURES): Vit D, 25-Hydroxy: 38 ng/mL (ref 30–100)

## 2016-02-12 LAB — FOLATE RBC: RBC Folate: 814 ng/mL (ref >280)

## 2016-02-28 IMAGING — US US ART/VEN ABD/PELV/SCROTUM DOPPLER LTD
1 series · 15 of 25 positions shown · non-contrast
Comparison: None.

CLINICAL DATA: Abdominal pain



[Series 1: us art/ven abd/pelv/scrotum doppler ltd · 15 of 54 slices shown]
[im 1/54]
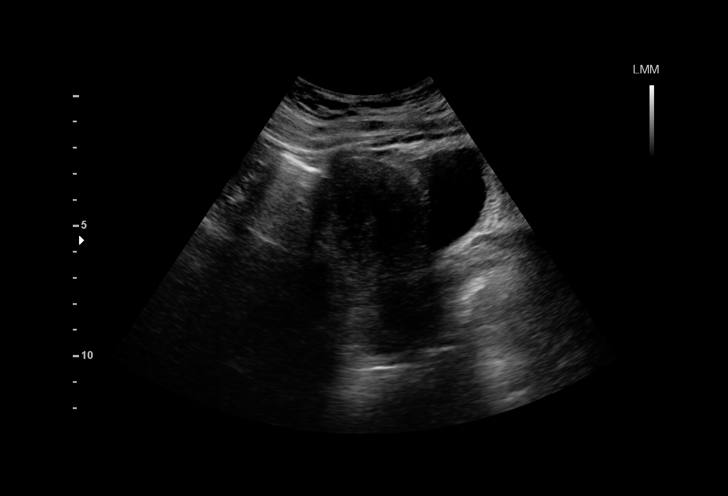
[im 5/54]
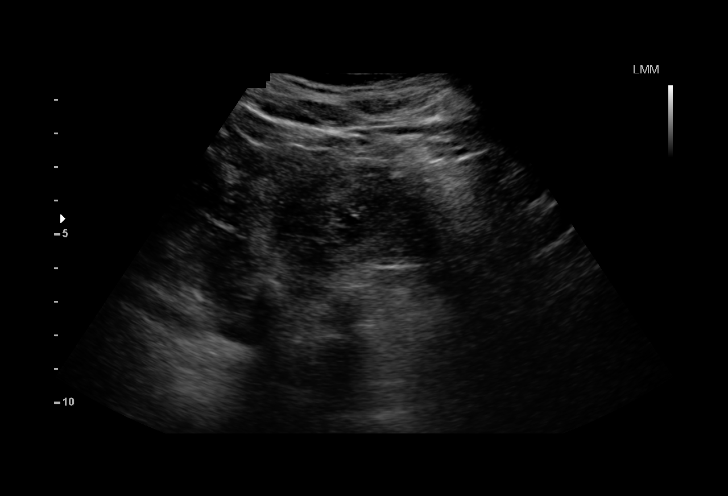
[im 9/54]
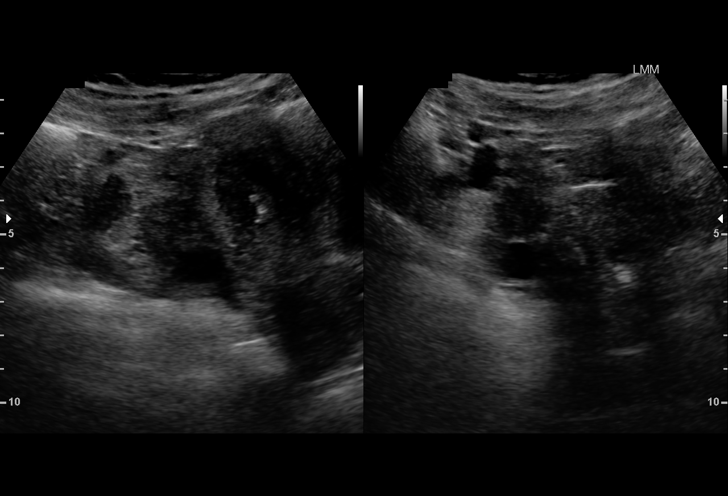
[im 12/54]
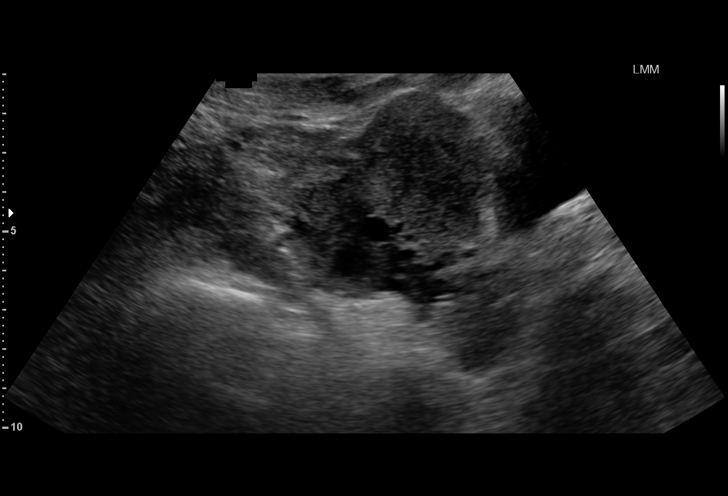
[im 16/54]
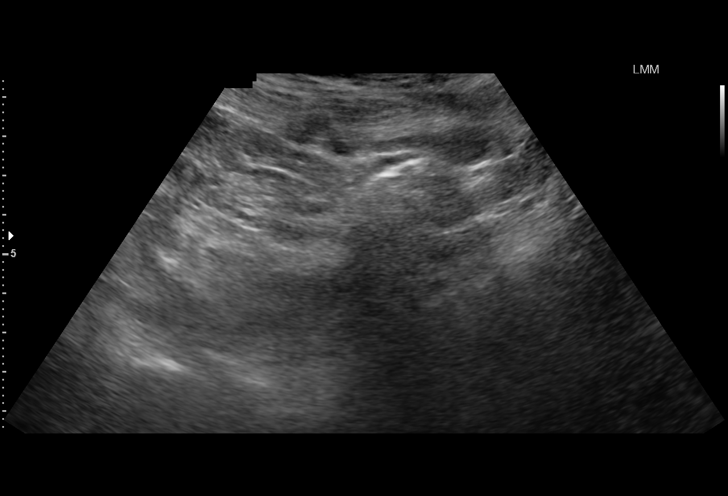
[im 20/54]
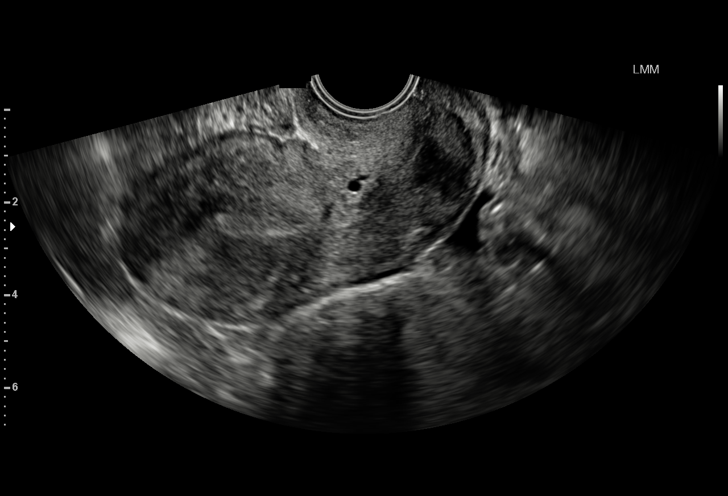
[im 23/54]
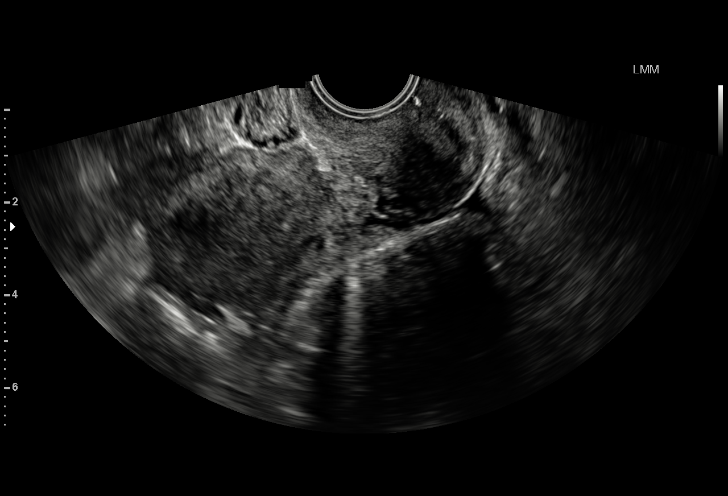
[im 27/54]
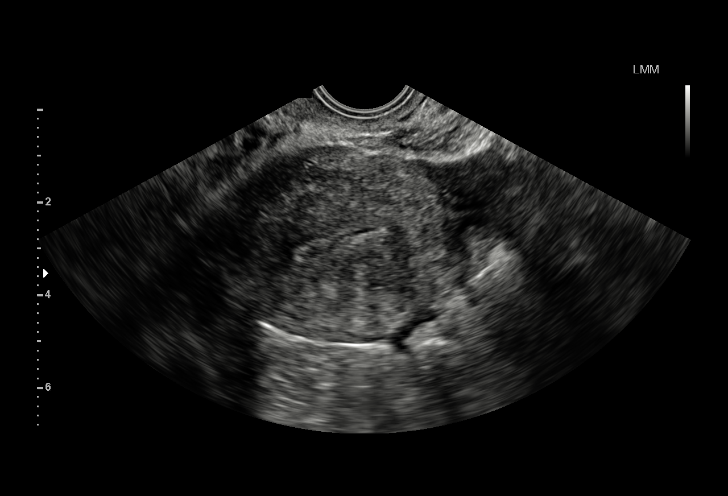
[im 31/54]
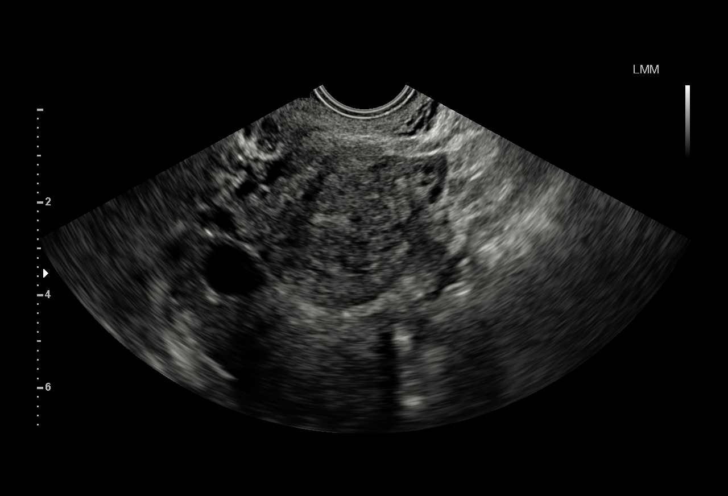
[im 34/54]
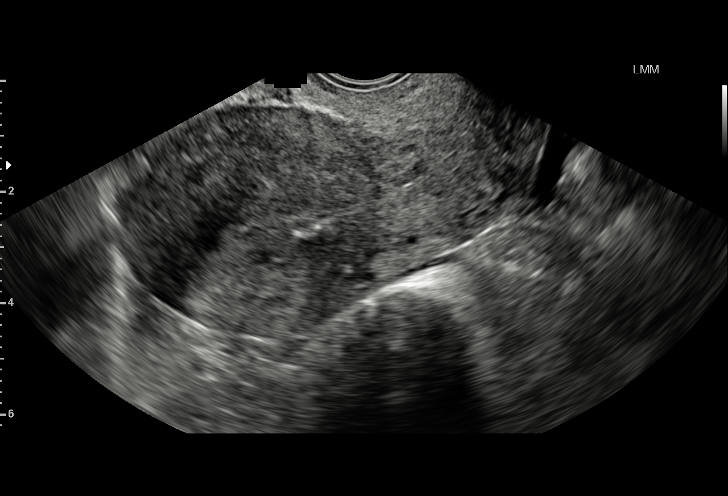
[im 38/54]
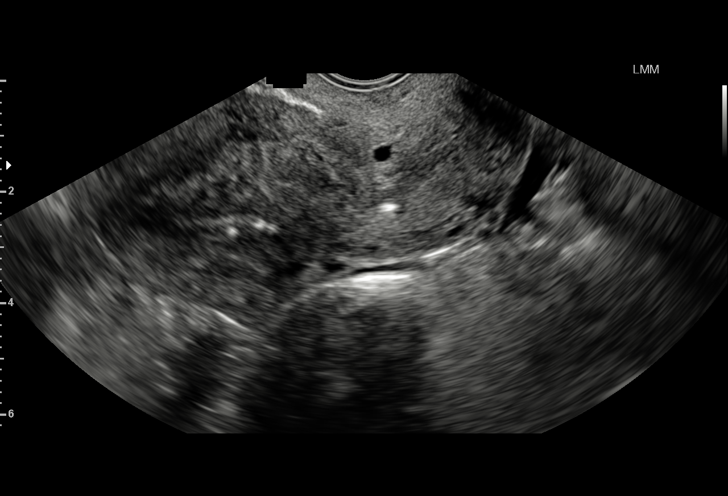
[im 42/54]
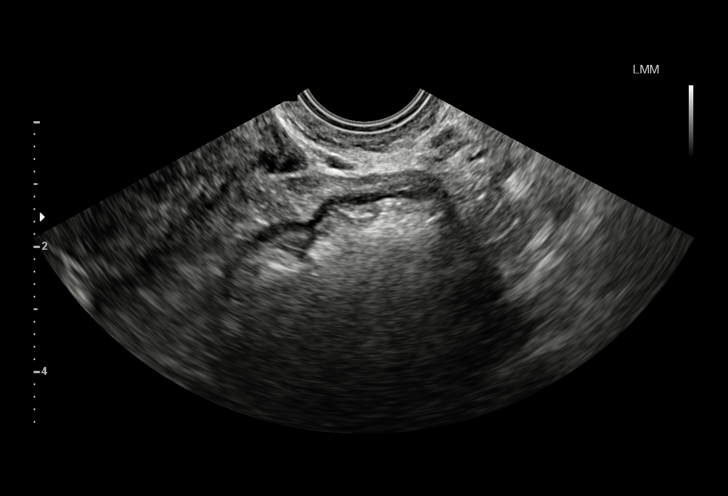
[im 45/54]
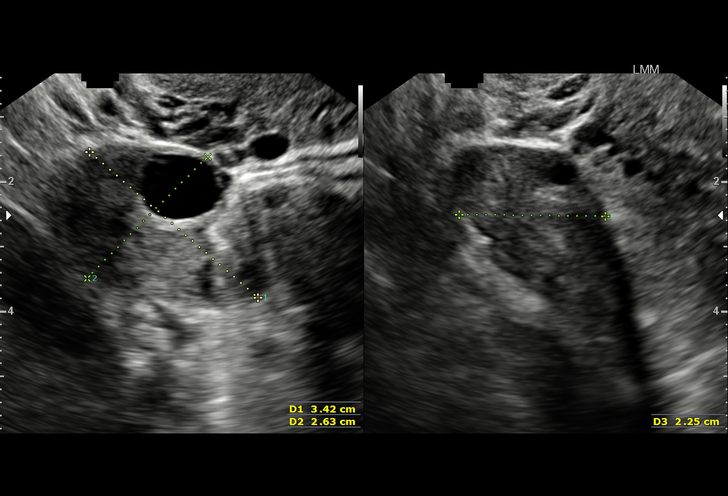
[im 49/54]
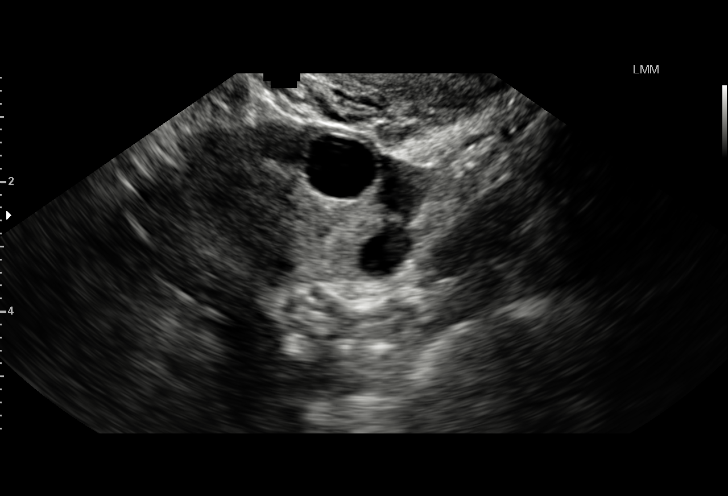
[im 54/54]
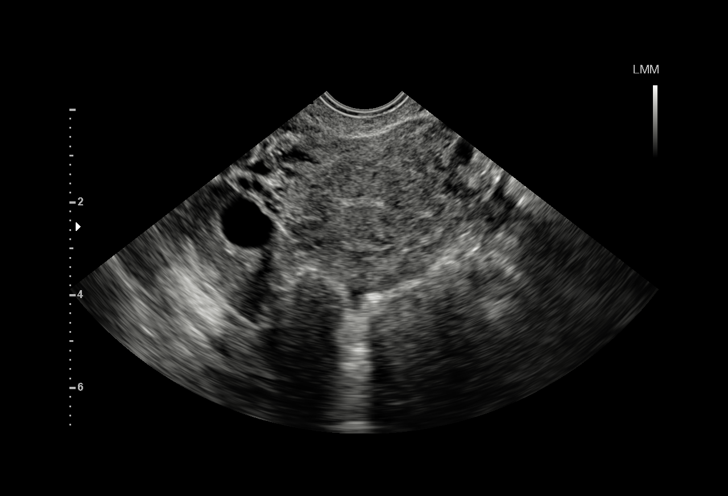

[15 of 25 positions shown; findings below may reference images not displayed]

FINDINGS: Uterus

Measurements: 8 x 4 x 6 cm. No fibroids or other mass visualized.

Endometrium

Thickness: 6 mm.  No focal abnormality visualized.

Right ovary

Measurements: 34 x 26 x 23 mm.. Normal appearance/no adnexal mass.

Left ovary

No ovary seen. Echogenic shadowing structure in the left adnexa is
likely sigmoid colon as there is bowel wall signature ventrally.

Pulsed Doppler evaluation of single visualizes right ovary
demonstrates normal low-resistance arterial and venous waveform.

Other findings

No free fluid.
IMPRESSION: 1. Normal right ovary. A left ovary could not be visualized,
correlating with history of oophorectomy (although reported to be on
the right).
2. 6 mm endometrial stripe.

## 2016-03-27 ENCOUNTER — Encounter: Payer: Self-pay | Admitting: Physician Assistant

## 2016-03-27 DIAGNOSIS — F32A Depression, unspecified: Secondary | ICD-10-CM

## 2016-03-27 DIAGNOSIS — F329 Major depressive disorder, single episode, unspecified: Secondary | ICD-10-CM

## 2016-03-27 DIAGNOSIS — Z0001 Encounter for general adult medical examination with abnormal findings: Secondary | ICD-10-CM

## 2016-03-27 MED ORDER — ESCITALOPRAM OXALATE 20 MG PO TABS: 20.0000 mg | ORAL_TABLET | Freq: Every day | ORAL | Status: DC

## 2016-04-27 ENCOUNTER — Encounter: Payer: Self-pay | Admitting: Physician Assistant

## 2016-04-27 ENCOUNTER — Other Ambulatory Visit: Payer: Self-pay | Admitting: Internal Medicine

## 2016-04-27 MED ORDER — ALPRAZOLAM 1 MG PO TABS: ORAL_TABLET | ORAL | Status: DC

## 2016-05-13 ENCOUNTER — Encounter: Payer: Self-pay | Admitting: Physician Assistant

## 2016-05-13 DIAGNOSIS — J069 Acute upper respiratory infection, unspecified: Secondary | ICD-10-CM

## 2016-05-13 DIAGNOSIS — F329 Major depressive disorder, single episode, unspecified: Secondary | ICD-10-CM

## 2016-05-13 DIAGNOSIS — F32A Depression, unspecified: Secondary | ICD-10-CM

## 2016-05-13 DIAGNOSIS — Z0001 Encounter for general adult medical examination with abnormal findings: Secondary | ICD-10-CM

## 2016-05-13 MED ORDER — CETIRIZINE HCL 10 MG PO TABS: 10.0000 mg | ORAL_TABLET | Freq: Every day | ORAL | 99 refills | Status: DC

## 2016-05-13 MED ORDER — ALPRAZOLAM 1 MG PO TABS: ORAL_TABLET | ORAL | 1 refills | Status: DC

## 2016-05-13 MED ORDER — TRAZODONE HCL 150 MG PO TABS: 150.0000 mg | ORAL_TABLET | Freq: Every day | ORAL | 1 refills | Status: DC

## 2016-05-13 MED ORDER — MOMETASONE FURO-FORMOTEROL FUM 100-5 MCG/ACT IN AERO: 2.0000 | INHALATION_SPRAY | Freq: Two times a day (BID) | RESPIRATORY_TRACT | 1 refills | Status: DC

## 2016-05-13 MED ORDER — BUPROPION HCL ER (XL) 150 MG PO TB24: 150.0000 mg | ORAL_TABLET | ORAL | 2 refills | Status: DC

## 2016-05-13 MED ORDER — ESCITALOPRAM OXALATE 20 MG PO TABS: 20.0000 mg | ORAL_TABLET | Freq: Every day | ORAL | 1 refills | Status: DC

## 2016-06-18 ENCOUNTER — Other Ambulatory Visit: Payer: Self-pay | Admitting: Physician Assistant

## 2016-06-18 ENCOUNTER — Other Ambulatory Visit: Payer: Self-pay | Admitting: Obstetrics and Gynecology

## 2016-06-18 DIAGNOSIS — Z1231 Encounter for screening mammogram for malignant neoplasm of breast: Secondary | ICD-10-CM

## 2016-06-19 ENCOUNTER — Ambulatory Visit: Payer: Self-pay | Admitting: Physician Assistant

## 2016-06-19 ENCOUNTER — Other Ambulatory Visit: Payer: Self-pay

## 2016-06-19 ENCOUNTER — Ambulatory Visit (INDEPENDENT_AMBULATORY_CARE_PROVIDER_SITE_OTHER): Payer: BLUE CROSS/BLUE SHIELD | Admitting: Physician Assistant

## 2016-06-19 DIAGNOSIS — M26609 Unspecified temporomandibular joint disorder, unspecified side: Secondary | ICD-10-CM

## 2016-06-19 DIAGNOSIS — J069 Acute upper respiratory infection, unspecified: Secondary | ICD-10-CM | POA: Diagnosis not present

## 2016-06-19 MED ORDER — PREDNISONE 20 MG PO TABS: ORAL_TABLET | ORAL | 0 refills | Status: DC

## 2016-06-19 NOTE — Progress Notes (Signed)
Subjective:    Patient ID: Bridget Watson, female    DOB: Jun 24, 1971, 45 y.o.   MRN: [id number]  HPI 45 y.o. AAF presents with right ear pain x 1 week. Thought it was pressure from fluid but has been getting worse. She is on zyrtec and flonase, states allergies have been controlled. Denies fever or chills.   Blood pressure (P) 130/80, pulse (!) (P) 106, temperature (P) 97.7 F (36.5 C), weight (P) 256 lb 9.6 oz (116.4 kg).  Medications Current Outpatient Prescriptions on File Prior to Visit  Medication Sig  . albuterol (VENTOLIN HFA) 108 (90 Base) MCG/ACT inhaler Inhale 2 puffs into the lungs every 4 (four) hours as needed for wheezing or shortness of breath. Make sure you rinse mouth out after each use.  Marland Kitchen ALPRAZolam (XANAX) 1 MG tablet take 1 tablet by mouth three times a day if needed  . buPROPion (WELLBUTRIN XL) 150 MG 24 hr tablet Take 1 tablet (150 mg total) by mouth every morning.  . calcium citrate-vitamin D (CITRACAL+D) 315-200 MG-UNIT per tablet Take by mouth.  . cetirizine (ZYRTEC) 10 MG tablet Take 1 tablet (10 mg total) by mouth daily.  . Ciclopirox 1 % shampoo Apply topically at bed time.  . Cyanocobalamin (VITAMIN B-12) 5000 MCG SUBL Place 1 tablet under the tongue daily.  Marland Kitchen escitalopram (LEXAPRO) 20 MG tablet Take 1 tablet (20 mg total) by mouth daily.  . fluticasone (FLONASE) 50 MCG/ACT nasal spray Place 2 sprays into both nostrils daily.  . Methylphenidate HCl ER (QUILLIVANT XR) 25 MG/5ML SUSR 12 ML a day total  . mometasone-formoterol (DULERA) 100-5 MCG/ACT AERO Inhale 2 puffs into the lungs 2 (two) times daily. Make sure you rinse your mouth after each use.  . traZODone (DESYREL) 150 MG tablet Take 1 tablet (150 mg total) by mouth daily with breakfast.  . triamcinolone cream (KENALOG) 0.1 % Apply 1 application topically 4 (four) times daily.   No current facility-administered medications on file prior to visit.     Problem list She has Hypertension; Hyperlipidemia;  Obesity; Allergy; GERD (gastroesophageal reflux disease); Major depression (HCC); ADHD (attention deficit hyperactivity disorder); Asthma; Peripheral neuropathy (HCC); Pre-diabetes; S/P gastric bypass; Family history of breast cancer; Anxiety disorder due to general medical condition with panic attack; and OCD (obsessive compulsive disorder) on her problem list.   Review of Systems  Constitutional: Negative.  Negative for chills, fatigue and fever.  HENT: Positive for ear pain. Negative for congestion, dental problem, drooling, ear discharge, facial swelling, hearing loss, mouth sores, nosebleeds, postnasal drip, rhinorrhea, sinus pressure, sneezing, sore throat, tinnitus and trouble swallowing.   Eyes: Negative.  Negative for visual disturbance.  Respiratory: Negative for apnea, cough, choking, chest tightness, shortness of breath, wheezing and stridor.   Cardiovascular: Negative.   Genitourinary: Negative.   Neurological: Negative for dizziness, tremors, seizures, syncope, facial asymmetry, speech difficulty, weakness, light-headedness, numbness and headaches.       Objective:   Physical Exam  Constitutional: She is oriented to person, place, and time. She appears well-developed and well-nourished.  HENT:  Head: Normocephalic and atraumatic.  Right Ear: Hearing and external ear normal. No mastoid tenderness. Tympanic membrane is not injected, not erythematous, not retracted and not bulging. A middle ear effusion is present.  Left Ear: Hearing and external ear normal. No mastoid tenderness. Tympanic membrane is not injected, not erythematous, not retracted and not bulging. A middle ear effusion is present.  + TMJ right worse than left, no abscesses noted  Eyes: Conjunctivae are normal. Pupils are equal, round, and reactive to light.  Neck: Normal range of motion. Neck supple.  Cardiovascular: Normal rate.   Pulmonary/Chest: Effort normal and breath sounds normal.  Musculoskeletal: Normal  range of motion.  Neurological: She is alert and oriented to person, place, and time.  Skin: Skin is warm and dry.       Assessment & Plan:  Right ear pain- ? Effusion versus TMJ Continue flonase/zyrtec, autoinflation taught, heat, soft foods x 3 days Follow up if not better or worse, or if get fever, chills, etc.  Follow up dentist

## 2016-06-19 NOTE — Patient Instructions (Signed)
What is the TMJ? The temporomandibular (tem-PUH-ro-man-DIB-yoo-ler) joint, or the TMJ, connects the upper and lower jawbones. This joint allows the jaw to open wide and move back and forth when you chew, talk, or yawn.There are also several muscles that help this joint move. There can be muscle tightness and pain in the muscle that can cause several symptoms.  What causes TMJ pain? There are many causes of TMJ pain. Repeated chewing (for example, chewing gum) and clenching your teeth can cause pain in the joint. Some TMJ pain has no obvious cause. What can I do to ease the pain? There are many things you can do to help your pain get better. When you have pain:  Eat soft foods and stay away from chewy foods (for example, taffy) Try to use both sides of your mouth to chew Don't chew gum Massage Don't open your mouth wide (for example, during yawning or singing) Don't bite your cheeks or fingernails Lower your amount of stress and worry Applying a warm, damp washcloth to the joint may help. Over-the-counter pain medicines such as ibuprofen (one brand: Advil) or acetaminophen (one brand: Tylenol) might also help. Do not use these medicines if you are allergic to them or if your doctor told you not to use them. How can I stop the pain from coming back? When your pain is better, you can do these exercises to make your muscles stronger and to keep the pain from coming back:  Resisted mouth opening: Place your thumb or two fingers under your chin and open your mouth slowly, pushing up lightly on your chin with your thumb. Hold for three to six seconds. Close your mouth slowly. Resisted mouth closing: Place your thumbs under your chin and your two index fingers on the ridge between your mouth and the bottom of your chin. Push down lightly on your chin as you close your mouth. Tongue up: Slowly open and close your mouth while keeping the tongue touching the roof of the mouth. Side-to-side jaw movement:  Place an object about one fourth of an inch thick (for example, two tongue depressors) between your front teeth. Slowly move your jaw from side to side. Increase the thickness of the object as the exercise becomes easier Forward jaw movement: Place an object about one fourth of an inch thick between your front teeth and move the bottom jaw forward so that the bottom teeth are in front of the top teeth. Increase the thickness of the object as the exercise becomes easier. These exercises should not be painful. If it hurts to do these exercises, stop doing them and talk to your family doctor.     

## 2016-07-15 ENCOUNTER — Ambulatory Visit
Admission: RE | Admit: 2016-07-15 | Discharge: 2016-07-15 | Disposition: A | Discharge status: Home / Self Care | Source: Ambulatory Visit | Attending: Physician Assistant | Admitting: Physician Assistant

## 2016-07-15 DIAGNOSIS — Z1231 Encounter for screening mammogram for malignant neoplasm of breast: Secondary | ICD-10-CM

## 2016-08-10 ENCOUNTER — Encounter: Payer: Self-pay | Admitting: Physician Assistant

## 2016-08-10 ENCOUNTER — Other Ambulatory Visit: Payer: Self-pay | Admitting: Physician Assistant

## 2016-08-10 ENCOUNTER — Ambulatory Visit (INDEPENDENT_AMBULATORY_CARE_PROVIDER_SITE_OTHER): Payer: BLUE CROSS/BLUE SHIELD | Admitting: Physician Assistant

## 2016-08-10 VITALS — BP 128/82 | HR 87 | Temp 97.7°F | Resp 16 | Ht 63.5 in | Wt 256.8 lb

## 2016-08-10 DIAGNOSIS — Z9884 Bariatric surgery status: Secondary | ICD-10-CM | POA: Diagnosis not present

## 2016-08-10 DIAGNOSIS — E559 Vitamin D deficiency, unspecified: Secondary | ICD-10-CM | POA: Diagnosis not present

## 2016-08-10 DIAGNOSIS — E785 Hyperlipidemia, unspecified: Secondary | ICD-10-CM

## 2016-08-10 DIAGNOSIS — R7303 Prediabetes: Secondary | ICD-10-CM | POA: Diagnosis not present

## 2016-08-10 DIAGNOSIS — G6289 Other specified polyneuropathies: Secondary | ICD-10-CM | POA: Diagnosis not present

## 2016-08-10 DIAGNOSIS — I10 Essential (primary) hypertension: Secondary | ICD-10-CM | POA: Diagnosis not present

## 2016-08-10 DIAGNOSIS — Z79899 Other long term (current) drug therapy: Secondary | ICD-10-CM

## 2016-08-10 LAB — CBC WITH DIFFERENTIAL/PLATELET
BASOS PCT: 0 %
Basophils Absolute: 0 cells/uL (ref 0–200)
EOS ABS: 156 {cells}/uL (ref 15–500)
Eosinophils Relative: 2 %
HEMATOCRIT: 40.3 % (ref 35.0–45.0)
HEMOGLOBIN: 13.2 g/dL (ref 11.7–15.5)
Lymphocytes Relative: 34 %
Lymphs Abs: 2652 cells/uL (ref 850–3900)
MCH: 27.7 pg (ref 27.0–33.0)
MCHC: 32.8 g/dL (ref 32.0–36.0)
MCV: 84.5 fL (ref 80.0–100.0)
MONO ABS: 390 {cells}/uL (ref 200–950)
MPV: 11 fL (ref 7.5–12.5)
Monocytes Relative: 5 %
NEUTROS ABS: 4602 {cells}/uL (ref 1500–7800)
Neutrophils Relative %: 59 %
Platelets: 308 10*3/uL (ref 140–400)
RBC: 4.77 MIL/uL (ref 3.80–5.10)
RDW: 14.4 % (ref 11.0–15.0)
WBC: 7.8 10*3/uL (ref 3.8–10.8)

## 2016-08-10 LAB — TSH: TSH: 0.75 mIU/L

## 2016-08-10 MED ORDER — PHENTERMINE HCL 37.5 MG PO TABS: 37.5000 mg | ORAL_TABLET | Freq: Every day | ORAL | 2 refills | Status: DC

## 2016-08-10 MED ORDER — BUDESONIDE-FORMOTEROL FUMARATE 160-4.5 MCG/ACT IN AERO: 2.0000 | INHALATION_SPRAY | Freq: Two times a day (BID) | RESPIRATORY_TRACT | 12 refills | Status: DC

## 2016-08-10 MED ORDER — FLUTICASONE FUROATE-VILANTEROL 100-25 MCG/INH IN AEPB: 1.0000 | INHALATION_SPRAY | Freq: Every day | RESPIRATORY_TRACT | 2 refills | Status: DC

## 2016-08-10 NOTE — Progress Notes (Signed)
Assessment and Plan:  Hypertension -Continue medication, monitor blood pressure at home. Continue DASH diet.  Reminder to go to the ER if any CP, SOB, nausea, dizziness, severe HA, changes vision/speech, left arm numbness and tingling and jaw pain.  Cholesterol -Continue diet and exercise. Check cholesterol.   Prediabetes  -Continue diet and exercise. Check A1C  Vitamin D Def - check level and continue medications.   Obesity with co morbidities s/p gastric bypass - long discussion about weight loss, diet, and exercise - will add on phentermine- follow up 1 month   Ear effusion, no infection -Allergy pill, flonase, autoinflation, explained no need for ABX at this time.   Future Appointments Date Time Provider Department Center  02/10/2017 10:00 AM Quentin Mulling, PA-C [facility] None    Continue diet and meds as discussed. Further disposition pending results of labs. Over 30 minutes of exam, counseling, chart review, and critical decision making was performed  HPI 45 y.o. female  presents for 3 month follow up on hypertension, cholesterol, prediabetes, and vitamin D deficiency.   Her blood pressure has been controlled at home, today their BP is BP: 128/82  She does workout, 1 hour of cardio and 30 mins weight training 4 days a week.  She denies chest pain, shortness of breath, dizziness.  She states dulera is too expensive, would like symbicort for asthma and allergies.   She is not on cholesterol medication and denies myalgias. Her cholesterol is at goal. The cholesterol last visit was:   She has been working on diet and exercise for prediabetes, and denies paresthesia of the feet, polydipsia, polyuria and visual disturbances. Last A1C in the office was:  Lab Results  Component Value Date   HGBA1C 5.8 (H) 02/11/2016   Patient is on Vitamin D supplement, was 129, takes 1400-1800 IU daily.   Lab Results  Component Value Date   VD25OH 38 02/11/2016    She has depression and  ADD follows with Dr. Elisabeth Most.  BMI is Body mass index is 44.78 kg/m., she is working on diet and exercise, and she is s/p gastric bypass on July 17th 2015. States she has been eating 1600 calories a day and thought 40 mins on elliptical was 350 calories, eating a lot of brown rice.  Wt Readings from Last 09 Encounters:  08/10/16 256 lb 12.8 oz (116.5 kg)  06/19/16 (P) 256 lb 9.6 oz (116.4 kg)  02/11/16 249 lb 9.6 oz (113.2 kg)  12/27/15 245 lb (111.1 kg)  09/18/15 234 lb (106.1 kg)  07/10/15 237 lb 6.4 oz (107.7 kg)  06/29/15 228 lb (103.4 kg)  01/07/15 231 lb (104.8 kg)  10/03/14 240 lb (108.9 kg)    Current Medications:  Current Outpatient Prescriptions on File Prior to Visit  Medication Sig Dispense Refill  . albuterol (VENTOLIN HFA) 108 (90 Base) MCG/ACT inhaler Inhale 2 puffs into the lungs every 4 (four) hours as needed for wheezing or shortness of breath. Make sure you rinse mouth out after each use. 1 Inhaler 0  . ALPRAZolam (XANAX) 1 MG tablet take 1 tablet by mouth three times a day if needed 90 tablet 1  . buPROPion (WELLBUTRIN XL) 150 MG 24 hr tablet Take 1 tablet (150 mg total) by mouth every morning. 90 tablet 2  . calcium citrate-vitamin D (CITRACAL+D) 315-200 MG-UNIT per tablet Take by mouth.    . cetirizine (ZYRTEC) 10 MG tablet Take 1 tablet (10 mg total) by mouth daily. 90 tablet PRN  . Cyanocobalamin (VITAMIN  B-12) 5000 MCG SUBL Place 1 tablet under the tongue daily.    Marland Kitchen. escitalopram (LEXAPRO) 20 MG tablet Take 1 tablet (20 mg total) by mouth daily. 90 tablet 1  . Methylphenidate HCl ER (QUILLIVANT XR) 25 MG/5ML SUSR 12 ML a day total 180 mL 0  . mometasone-formoterol (DULERA) 100-5 MCG/ACT AERO Inhale 2 puffs into the lungs 2 (two) times daily. Make sure you rinse your mouth after each use. 3 Inhaler 1  . traZODone (DESYREL) 150 MG tablet Take 1 tablet (150 mg total) by mouth daily with breakfast. 90 tablet 1   No current facility-administered medications on file  prior to visit.    Medical History:  Past Medical History:  Diagnosis Date  . ADD (attention deficit disorder)   . Allergy   . Anxiety   . Asthma   . Depression   . GERD (gastroesophageal reflux disease)   . Gestational diabetes   . Hyperlipidemia   . Infection    UTI  . Obesity   . OCD (obsessive compulsive disorder)   . Ovarian cyst   . Peripheral neuropathy (HCC)   . Preterm labor   . Type II or unspecified type diabetes mellitus without mention of complication, not stated as uncontrolled    Allergies:  Allergies  Allergen Reactions  . Keflex [Cephalexin] Swelling and Hives     Review of Systems:  Review of Systems  Constitutional: Negative.   HENT: Positive for congestion, ear pain and tinnitus. Negative for ear discharge, hearing loss, nosebleeds and sore throat.   Eyes: Negative.   Respiratory: Negative.  Negative for stridor.   Cardiovascular: Negative.   Gastrointestinal: Positive for constipation. Negative for abdominal pain, blood in stool, diarrhea, heartburn, melena, nausea and vomiting.  Genitourinary: Negative.   Musculoskeletal: Negative.   Skin: Negative.   Neurological: Negative.  Negative for headaches.  Endo/Heme/Allergies: Negative.   Psychiatric/Behavioral: Negative.     Family history- Review and unchanged Social history- Review and unchanged Physical Exam: BP 128/82   Pulse 87   Temp 97.7 F (36.5 C)   Resp 16   Ht 5' 3.5" (1.613 m)   Wt 256 lb 12.8 oz (116.5 kg)   LMP 06/22/2016   SpO2 98%   BMI 44.78 kg/m  Wt Readings from Last 3 Encounters:  08/10/16 256 lb 12.8 oz (116.5 kg)  06/19/16 (P) 256 lb 9.6 oz (116.4 kg)  02/11/16 249 lb 9.6 oz (113.2 kg)   General Appearance: Well nourished, in no apparent distress. Eyes: PERRLA, EOMs, conjunctiva no swelling or erythema Sinuses: No Frontal/maxillary tenderness ENT/Mouth: Ext aud canals clear, TMs without erythema, bulging. No erythema, swelling, or exudate on post pharynx.   Tonsils not swollen or erythematous. Hearing normal.  Neck: Supple, thyroid normal.  Respiratory: Respiratory effort normal, BS equal bilaterally without rales, rhonchi, wheezing or stridor.  Cardio: RRR with no MRGs. Brisk peripheral pulses without edema.  Abdomen: Soft, + BS,  Non tender, no guarding, rebound, hernias, masses. Lymphatics: Non tender without lymphadenopathy.  Musculoskeletal: Full ROM, 5/5 strength, Normal gait Skin: Warm, dry without rashes, lesions, ecchymosis.  Neuro: Cranial nerves intact. Normal muscle tone, no cerebellar symptoms. Psych: Awake and oriented X 3, normal affect, Insight and Judgment appropriate.    Quentin MullingAmanda Lakeitha Basques, PA-C 11:44 AM [facility]

## 2016-08-10 NOTE — Patient Instructions (Addendum)
Phentermine  While taking the medication we may ask that you come into the office once a month or once every 2-3 months to monitor your weight, blood pressure, and heart rate. In addition we can help answer your questions about diet, exercise, and help you every step of the way with your weight loss journey. Sometime it is helpful if you bring in a food diary or use an app on your phone such as myfitnesspal to record your calorie intake, especially in the beginning.   You can start out on 1/3 to 1/2 a pill in the morning and if you are tolerating it well you can increase to one pill daily. I also have some patients that take 1/3 or 1/2 at lunch to help prevent night time eating.  This medication is cheapest CASH pay at [facility] OR [employer] [facility] is 14-17 dollars and you do NOT need a membership to get meds from there.    What is this medicine? PHENTERMINE (FEN ter meen) decreases your appetite. This medicine is intended to be used in addition to a healthy reduced calorie diet and exercise. The best results are achieved this way. This medicine is only indicated for short-term use. Eventually your weight loss may level out and the medication will no longer be needed.   How should I use this medicine? Take this medicine by mouth. Follow the directions on the prescription label. The tablets should stay in the bottle until immediately before you take your dose. Take your doses at regular intervals. Do not take your medicine more often than directed.  Overdosage: If you think you have taken too much of this medicine contact a poison control center or emergency room at once. NOTE: This medicine is only for you. Do not share this medicine with others.  What if I miss a dose? If you miss a dose, take it as soon as you can. If it is almost time for your next dose, take only that dose. Do not take double or extra doses. Do not increase or in any way change your dose without consulting your  doctor.  What should I watch for while using this medicine? Notify your physician immediately if you become short of breath while doing your normal activities. Do not take this medicine within 6 hours of bedtime. It can keep you from getting to sleep. Avoid drinks that contain caffeine and try to stick to a regular bedtime every night. Do not stand or sit up quickly, especially if you are an older patient. This reduces the risk of dizzy or fainting spells. Avoid alcoholic drinks.  What side effects may I notice from receiving this medicine? Side effects that you should report to your doctor or health care professional as soon as possible: -chest pain, palpitations -depression or severe changes in mood -increased blood pressure -irritability -nervousness or restlessness -severe dizziness -shortness of breath -problems urinating -unusual swelling of the legs -vomiting  Side effects that usually do not require medical attention (report to your doctor or health care professional if they continue or are bothersome): -blurred vision or other eye problems -changes in sexual ability or desire -constipation or diarrhea -difficulty sleeping -dry mouth or unpleasant taste -headache -nausea This list may not describe all possible side effects. Call your doctor for medical advice about side effects. You may report side effects to FDA at 1-800-FDA-1088.    Who Qualifies for Obesity Medications? Although everyone is hopeful for a fast and easy way to lose weight,  nothing has been shown to replace a prudent, calorie-controlled diet along with behavior modification as a cornerstone for all obesity treatments.  The next tool that can be used to achieve weight-loss and health improvement is medication. Pharmacotherapy may be offered to individuals affected by obesity who have failed to achieve weight-loss through diet and exercise alone. Currently there are several drugs that are approved by the FDA  for weight-loss: phentermine products (Adipex-P or Suprenza)  lorcaserin HCI (Belviq) phentermine- topiramate ER (Qsymia)  Bupropion; Naltrexone ER (Contrave)  Let's take a closer look at each of these medications and learn how they work:  Phentermine (Adipex-P or Suprenza)- would not suggest How does it work? Phentermine is a medication available by prescription that works on chemicals in the brain to decrease your appetite. It also has a mild stimulant component that adds extra energy. Phentermine is a pill that is taken once a day in the morning time. Tolerance to this medication can develop, so it can only be used for several months at a time. Common side effects are dry mouth, sleeplessness, constipation. Weight-loss: The average weight-loss is 4-5 percent of your weight after one-year. In a 200 pound person, this means about 10 pounds of weight-loss. Patients who receive phentermine can usually expect to see greater weight-loss than those who receive non-pharmacologic care, on average about 13 pounds difference over 12 weeks as reported in one study. Concerns: Due to its stimulant effect, a person's blood pressure and heart rate may increase when on this medication; therefore, you must be monitored closely by a physician who is experienced in prescribing this medication. It cannot be used in patients with some heart conditions (such as poorly controlled blood pressure), glaucoma (increased pressure in your eye), stroke or overactive thyroid. There is some concern for abuse, but this is minimal if the medication is appropriately used as directed by a healthcare professional.  Lorcaserin (Belviq) How does it work? Lorcaserin was approved in June 2012 by the FDA and became commercially available in June 2013. It works by helping you feel full while eating less, and it works on the chemicals in your brain to help decrease your appetite. Weight-loss: In individuals who took the medication for  one-year, it has been shown to have an average of 7 percent weight-loss. In a 200 pound person, this would mean a 14 pound weight-loss. Blood sugar, cholesterol and blood pressure levels have also been shown to improve. Concerns: The most common side effects are headache, dizziness, fatigue, dry mouth, upper WUJ:WJXBJYNW tract infection and nausea.  Response to therapy should be evaluated by week 12.  If a patient has not lost at least 5% of baseline body weigh  Phentermine-Topiramate ER (Qsymia) How does it work? This combination medication was approved by the FDA in July 2012. Topiramate is a medication used to treat seizures. It was found that a common side effect of this medication was weight-loss. Phentermine, as described in this brochure, helps to increase your energy and decrease your appetite. Weight-loss: Among individuals who took the highest does of Qsymia (15 mg phentermine and 92 mg of topiramate ER) for one-year, they achieved an average of 14.4 percent weight-loss. In a 200 pound person, a 14.4 percent weight-loss would mean a loss of 29 pounds. Cholesterol levels have also been shown to improve. Concerns: The most common side effects were dry mouth, constipation and pins-and-needle feeling in extremities. Qsymia should NOT be taken during pregnancy since Topiramate ER, a component of Qsymia, has been associated  with an increased risk of birth defects.  Bupropion; Naltrexone ER (Contrave) How does it work? Works in two areas of your brain, hunger center and reward center to reduce hunger and cravings.  Weight loss In a 56 week trial patients lost more than 5% of their body weight.  Concerns Most common side effects are dry mouth, constipation or diarrhea, headache.  Please take it with a full glass of water and low fat meal.    Follow-up Visits: Patients are given the opportunity to revisit a topic or obtain more information on an area of interest during follow-up visits.  The frequency of and interval between follow-up visits is determined on a patient-by-patient basis. Frequent visits (every 3 to 4 weeks) are encouraged until initial weight-loss goals (5 to 10 percent of body weight) are achieved. At that point, less frequent visits are typically scheduled as needed for individual patients. However, since obesity is considered a chronic life-long problem for many individuals, periodic continual follow up is recommended.   Research has shown that weight-loss as low as 5 percent of initial body weight can lead to favorable improvements in blood pressure, cholesterol, glucose levels and insulin sensitivity. The risk of developing heart disease is reduced the most in patients who have impaired glucose tolerance, type 2 diabetes or high blood pressure.

## 2016-08-11 LAB — HEPATIC FUNCTION PANEL
ALK PHOS: 29 U/L — AB (ref 33–115)
ALT: 16 U/L (ref 6–29)
AST: 20 U/L (ref 10–30)
Albumin: 4.1 g/dL (ref 3.6–5.1)
BILIRUBIN INDIRECT: 0.3 mg/dL (ref 0.2–1.2)
Bilirubin, Direct: 0 mg/dL (ref <=0.2)
TOTAL PROTEIN: 6.7 g/dL (ref 6.1–8.1)
Total Bilirubin: 0.3 mg/dL (ref 0.2–1.2)

## 2016-08-11 LAB — BASIC METABOLIC PANEL WITH GFR
BUN: 14 mg/dL (ref 7–25)
CALCIUM: 9.8 mg/dL (ref 8.6–10.2)
CO2: 23 mmol/L (ref 20–31)
Chloride: 106 mmol/L (ref 98–110)
Creat: 0.9 mg/dL (ref 0.50–1.10)
GFR, Est Non African American: 78 mL/min (ref >=60)
GLUCOSE: 93 mg/dL (ref 65–99)
Potassium: 4.4 mmol/L (ref 3.5–5.3)
Sodium: 141 mmol/L (ref 135–146)

## 2016-08-11 LAB — LIPID PANEL
CHOL/HDL RATIO: 4.5 ratio (ref <=5.0)
Cholesterol: 209 mg/dL — ABNORMAL HIGH (ref 125–200)
HDL: 46 mg/dL (ref >=46)
LDL CALC: 120 mg/dL (ref <130)
TRIGLYCERIDES: 214 mg/dL — AB (ref <150)
VLDL: 43 mg/dL — AB (ref <30)

## 2016-08-11 LAB — IRON AND TIBC
%SAT: 26 % (ref 11–50)
Iron: 76 ug/dL (ref 40–190)
TIBC: 293 ug/dL (ref 250–450)
UIBC: 217 ug/dL (ref 125–400)

## 2016-08-11 LAB — VITAMIN B12: VITAMIN B 12: 1673 pg/mL — AB (ref 200–1100)

## 2016-08-11 LAB — HEMOGLOBIN A1C
Hgb A1c MFr Bld: 5.7 % — ABNORMAL HIGH (ref <5.7)
MEAN PLASMA GLUCOSE: 117 mg/dL

## 2016-08-11 LAB — MAGNESIUM: MAGNESIUM: 2.1 mg/dL (ref 1.5–2.5)

## 2016-08-11 LAB — VITAMIN D 25 HYDROXY (VIT D DEFICIENCY, FRACTURES): Vit D, 25-Hydroxy: 33 ng/mL (ref 30–100)

## 2016-08-28 ENCOUNTER — Other Ambulatory Visit: Payer: Self-pay | Admitting: Physician Assistant

## 2016-08-28 ENCOUNTER — Telehealth: Payer: Self-pay

## 2016-08-28 DIAGNOSIS — Z0001 Encounter for general adult medical examination with abnormal findings: Secondary | ICD-10-CM

## 2016-08-28 MED ORDER — BUPROPION HCL ER (XL) 300 MG PO TB24: 300.0000 mg | ORAL_TABLET | ORAL | 0 refills | Status: DC

## 2016-08-28 NOTE — Telephone Encounter (Signed)
-----   Message from Quentin MullingAmanda Collier, [place] sent at 08/28/2016 10:06 AM EST ----- Regarding: RE: med increase Sent in 300mg  for her to take Marchelle FolksAmanda  ----- Message ----- From: Gregery NaAngela D Natavia Sublette, CMA Sent: 08/28/2016  10:00 AM To: Quentin MullingAmanda Collier, PA-C Subject: med increase                                   Pt states that her Wellbutrin should have been increased to 300 mgs daily from the 150 mgs daily she was taking & it was not. 150 mgs were sent in. Advise please.

## 2016-08-28 NOTE — Telephone Encounter (Signed)
Informed pt of rx that was sent in.  (300 mgs WELLBUTRIN)

## 2016-09-17 ENCOUNTER — Ambulatory Visit (INDEPENDENT_AMBULATORY_CARE_PROVIDER_SITE_OTHER): Payer: BLUE CROSS/BLUE SHIELD | Admitting: Physician Assistant

## 2016-09-17 DIAGNOSIS — Z9884 Bariatric surgery status: Secondary | ICD-10-CM

## 2016-09-17 DIAGNOSIS — I10 Essential (primary) hypertension: Secondary | ICD-10-CM

## 2016-09-17 MED ORDER — PHENTERMINE HCL 37.5 MG PO TABS: 37.5000 mg | ORAL_TABLET | Freq: Every day | ORAL | 2 refills | Status: DC

## 2016-09-17 NOTE — Progress Notes (Signed)
Assessment and Plan: Morbid obesity- continue phentermine, diet discussed  Future Appointments Date Time Provider Department Center  02/10/2017 10:00 AM Quentin MullingAmanda Calvary Difranco, PA-C [facility] None    HPI 45 y.o.female obese female s/p bypass presents for weight loss follow up. She was started on phentermine last visit and counseled on weight loss/eating habits. She was craving sweets, was waking up in the middle of the night and eating without remembering it.  BMI is Body mass index is 41.74 kg/m., she is working on diet and exercise. Goal is 200, she has been decreasing eating, doing very well with avoiding carbs and decreasing portions.  Wt Readings from Last 3 Encounters:  09/17/16 239 lb 6.4 oz (108.6 kg)  08/10/16 256 lb 12.8 oz (116.5 kg)  06/19/16 (P) 256 lb 9.6 oz (116.4 kg)    Past Medical History:  Diagnosis Date  . ADD (attention deficit disorder)   . Allergy   . Anxiety   . Asthma   . Depression   . GERD (gastroesophageal reflux disease)   . Gestational diabetes   . Hyperlipidemia   . Infection    UTI  . Obesity   . OCD (obsessive compulsive disorder)   . Ovarian cyst   . Peripheral neuropathy (HCC)   . Preterm labor   . Type II or unspecified type diabetes mellitus without mention of complication, not stated as uncontrolled      Allergies  Allergen Reactions  . Keflex [Cephalexin] Swelling and Hives      Current Outpatient Prescriptions on File Prior to Visit  Medication Sig Dispense Refill  . albuterol (VENTOLIN HFA) 108 (90 Base) MCG/ACT inhaler Inhale 2 puffs into the lungs every 4 (four) hours as needed for wheezing or shortness of breath. Make sure you rinse mouth out after each use. 1 Inhaler 0  . ALPRAZolam (XANAX) 1 MG tablet take 1 tablet by mouth three times a day if needed 90 tablet 1  . budesonide-formoterol (SYMBICORT) 160-4.5 MCG/ACT inhaler Inhale 2 puffs into the lungs 2 (two) times daily. 1 Inhaler 12  . buPROPion (WELLBUTRIN XL) 300 MG 24 hr  tablet Take 1 tablet (300 mg total) by mouth every morning. 90 tablet 0  . calcium citrate-vitamin D (CITRACAL+D) 315-200 MG-UNIT per tablet Take by mouth.    . cetirizine (ZYRTEC) 10 MG tablet Take 1 tablet (10 mg total) by mouth daily. 90 tablet PRN  . Cyanocobalamin (VITAMIN B-12) 5000 MCG SUBL Place 1 tablet under the tongue daily.    Marland Kitchen. escitalopram (LEXAPRO) 20 MG tablet Take 1 tablet (20 mg total) by mouth daily. 90 tablet 1  . fluticasone furoate-vilanterol (BREO ELLIPTA) 100-25 MCG/INH AEPB Inhale 1 puff into the lungs daily. Rinse mouth with water after each use 1 each 2  . Methylphenidate HCl ER (QUILLIVANT XR) 25 MG/5ML SUSR 12 ML a day total 180 mL 0  . mometasone-formoterol (DULERA) 100-5 MCG/ACT AERO Inhale 2 puffs into the lungs 2 (two) times daily. Make sure you rinse your mouth after each use. 3 Inhaler 1  . phentermine (ADIPEX-P) 37.5 MG tablet Take 1 tablet (37.5 mg total) by mouth daily before breakfast. 30 tablet 2  . traZODone (DESYREL) 150 MG tablet Take 1 tablet (150 mg total) by mouth daily with breakfast. 90 tablet 1   No current facility-administered medications on file prior to visit.     ROS: all negative except above.   Physical Exam: Filed Weights   09/17/16 1056  Weight: 239 lb 6.4 oz (108.6 kg)  Resp 16   Ht 5' 3.5" (1.613 m)   Wt 239 lb 6.4 oz (108.6 kg)   BMI 41.74 kg/m  General Appearance: Well nourished, in no apparent distress. Eyes: PERRLA, EOMs, conjunctiva no swelling or erythema Sinuses: No Frontal/maxillary tenderness ENT/Mouth: Ext aud canals clear, TMs without erythema, bulging. No erythema, swelling, or exudate on post pharynx.  Tonsils not swollen or erythematous. Hearing normal.  Neck: Supple, thyroid normal.  Respiratory: Respiratory effort normal, BS equal bilaterally without rales, rhonchi, wheezing or stridor.  Cardio: RRR with no MRGs. Brisk peripheral pulses without edema.  Abdomen: Soft, + BS.  Non tender, no guarding, rebound,  hernias, masses. Lymphatics: Non tender without lymphadenopathy.  Musculoskeletal: Full ROM, 5/5 strength, normal gait.  Skin: Warm, dry without rashes, lesions, ecchymosis.  Neuro: Cranial nerves intact. Normal muscle tone, no cerebellar symptoms. Sensation intact.  Psych: Awake and oriented X 3, normal affect, Insight and Judgment appropriate.     Quentin MullingAmanda Aashir Umholtz, PA-C 10:59 AM [facility]

## 2016-09-17 NOTE — Patient Instructions (Signed)
Try the melatonin 5mg -20mg  dissolvable or gummy 30 mins before bed  Try 80 oz of water a day  Ways to cut 100 calories  1. Eat your eggs with hot sauce OR salsa instead of cheese.  Eggs are great for breakfast, but many people consider eggs and cheese to be BFFs. Instead of cheese-1 oz. of cheddar has 114 calories-top your eggs with hot sauce, which contains no calories and helps with satiety and metabolism. Salsa is also a great option!!  2. Top your toast, waffles or pancakes with mashed berries instead of jelly or syrup. Half a cup of berries-fresh, frozen or thawed-has about 40 calories, compared with 2 tbsp. of maple syrup or jelly, which both have about 100 calories. The berries will also give you a good punch of fiber, which helps keep you full and satisfied and won't spike blood sugar quickly like the jelly or syrup. 3. Swap the non-fat latte for black coffee with a splash of half-and-half. Contrary to its name, that non-fat latte has 130 calories and a startling 19g of carbohydrates per 16 oz. serving. Replacing that 'light' drinkable dessert with a black coffee with a splash of half-and-half saves you more than 100 calories per 16 oz. serving. 4. Sprinkle salads with freeze-dried raspberries instead of dried cranberries. If you want a sweet addition to your nutritious salad, stay away from dried cranberries. They have a whopping 130 calories per  cup and 30g carbohydrates. Instead, sprinkle freeze-dried raspberries guilt-free and save more than 100 calories per  cup serving, adding 3g of belly-filling fiber. 5. Go for mustard in place of mayo on your sandwich. Mustard can add really nice flavor to any sandwich, and there are tons of varieties, from spicy to honey. A serving of mayo is 95 calories, versus 10 calories in a serving of mustard. 6. Choose a DIY salad dressing instead of the store-bought kind. Mix Dijon or whole grain mustard with low-fat Kefir or red wine vinegar and  garlic. 7. Use hummus as a spread instead of a dip. Use hummus as a spread on a high-fiber cracker or tortilla with a sandwich and save on calories without sacrificing taste. 8. Pick just one salad "accessory." Salad isn't automatically a calorie winner. It's easy to over-accessorize with toppings. Instead of topping your salad with nuts, avocado and cranberries (all three will clock in at 313 calories), just pick one. The next day, choose a different accessory, which will also keep your salad interesting. You don't wear all your jewelry every day, right? 9. Ditch the white pasta in favor of spaghetti squash. One cup of cooked spaghetti squash has about 40 calories, compared with traditional spaghetti, which comes with more than 200. Spaghetti squash is also nutrient-dense. It's a good source of fiber and Vitamins A and C, and it can be eaten just like you would eat pasta-with a great tomato sauce and [place] meatballs or with pesto, tofu and spinach, for example. 10. Dress up your chili, soups and stews with non-fat [place] yogurt instead of sour cream. Just a 'dollop' of sour cream can set you back 115 calories and a whopping 12g of fat-seven of which are of the artery-clogging variety. Added bonus: [place] yogurt is packed with muscle-building protein, calcium and B Vitamins. 11. Mash cauliflower instead of mashed potatoes. One cup of traditional mashed potatoes-in all their creamy goodness-has more than 200 calories, compared to mashed cauliflower, which you can typically eat for less than 100 calories per 1 cup serving. Cauliflower  is a great source of the antioxidant indole-3-carbinol (I3C), which may help reduce the risk of some cancers, like breast cancer. 12. Ditch the ice cream sundae in favor of a [place] yogurt parfait. Instead of a cup of ice cream or fro-yo for dessert, try 1 cup of nonfat Greek yogurt topped with fresh berries and a sprinkle of cacao nibs. Both toppings are packed with  antioxidants, which can help reduce cellular inflammation and oxidative damage. And the comparison is a no-brainer: One cup of ice cream has about 275 calories; one cup of frozen yogurt has about 230; and a cup of Greek yogurt has just 130, plus twice the protein, so you're less likely to return to the freezer for a second helping. 13. Put olive oil in a spray container instead of using it directly from the bottle. Each tablespoon of olive oil is 120 calories and 15g of fat. Use a mister instead of pouring it straight into the pan or onto a salad. This allows for portion control and will save you more than 100 calories. 14. When baking, substitute canned pumpkin for butter or oil. Canned pumpkin-not pumpkin pie mix-is loaded with Vitamin A, which is important for skin and eye health, as well as immunity. And the comparisons are pretty crazy:  cup of canned pumpkin has about 40 calories, compared to butter or oil, which has more than 800 calories. Yes, 800 calories. Applesauce and mashed banana can also serve as good substitutions for butter or oil, usually in a 1:1 ratio. 15. Top casseroles with high-fiber cereal instead of breadcrumbs. Breadcrumbs are typically made with white bread, while breakfast cereals contain 5-9g of fiber per serving. Not only will you save more than 150 calories per  cup serving, the swap will also keep you more full and you'll get a metabolism boost from the added fiber. 16. Snack on pistachios instead of macadamia nuts. Believe it or not, you get the same amount of calories from 35 pistachios (100 calories) as you would from only five macadamia nuts. 17. Chow down on kale chips rather than potato chips. This is my favorite 'don't knock it 'till you try it' swap. Kale chips are so easy to make at home, and you can spice them up with a little grated parmesan or chili powder. Plus, they're a mere fraction of the calories of potato chips, but with the same crunch factor we crave  so often. 18. Add seltzer and some fruit slices to your cocktail instead of soda or fruit juice. One cup of soda or fruit juice can pack on as much as 140 calories. Instead, use seltzer and fruit slices. The fruit provides valuable phytochemicals, such as flavonoids and anthocyanins, which help to combat cancer and stave off the aging process.  Remember exercise is great for your cardiovascular health and can help with weight loss but YOU CAN NOT OUT RUN YOUR FORK!

## 2016-10-06 ENCOUNTER — Other Ambulatory Visit: Payer: Self-pay | Admitting: Physician Assistant

## 2016-10-06 ENCOUNTER — Encounter: Payer: Self-pay | Admitting: Physician Assistant

## 2016-10-06 MED ORDER — FLUTICASONE FUROATE-VILANTEROL 100-25 MCG/INH IN AEPB: 1.0000 | INHALATION_SPRAY | Freq: Every day | RESPIRATORY_TRACT | 2 refills | Status: DC

## 2016-10-07 ENCOUNTER — Encounter: Payer: Self-pay | Admitting: Internal Medicine

## 2016-10-07 ENCOUNTER — Ambulatory Visit (INDEPENDENT_AMBULATORY_CARE_PROVIDER_SITE_OTHER): Payer: BLUE CROSS/BLUE SHIELD | Admitting: Internal Medicine

## 2016-10-07 VITALS — BP 140/84 | HR 92 | Temp 97.7°F | Resp 16 | Ht 63.5 in | Wt 236.2 lb

## 2016-10-07 DIAGNOSIS — J0410 Acute tracheitis without obstruction: Secondary | ICD-10-CM

## 2016-10-07 DIAGNOSIS — J320 Chronic maxillary sinusitis: Secondary | ICD-10-CM | POA: Diagnosis not present

## 2016-10-07 MED ORDER — AZITHROMYCIN 250 MG PO TABS: ORAL_TABLET | ORAL | 1 refills | Status: DC

## 2016-10-07 MED ORDER — BENZONATATE 200 MG PO CAPS: ORAL_CAPSULE | ORAL | 1 refills | Status: AC

## 2016-10-07 MED ORDER — PREDNISONE 20 MG PO TABS: ORAL_TABLET | ORAL | 0 refills | Status: DC

## 2016-10-07 NOTE — Progress Notes (Signed)
Subjective:    Patient ID: Bridget Watson, female    DOB: 02-22-1971, 45 y.o.   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 Also c/o Right cheek discomfort. Denies dyspnea, fevers/sweats, chills/rigors rash or dyspnea.   Medication Sig  . albuterol (VENTOLIN HFA) 108 (90 Base) MCG/ACT inhaler Inhale 2 puffs into the lungs every 4 (four) hours as needed for wheezing or shortness of breath. Make sure you rinse mouth out after each use.  Marland Kitchen. ALPRAZolam (XANAX) 1 MG tablet take 1 tablet by mouth three times a day if needed  . buPROPion (WELLBUTRIN XL) 300 MG 24 hr tablet Take 1 tablet (300 mg total) by mouth every morning.  . calcium citrate-vitamin D (CITRACAL+D) 315-200 MG-UNIT per tablet Take by mouth.  . Cyanocobalamin (VITAMIN B-12) 5000 MCG SUBL Place 1 tablet under the tongue daily.  Marland Kitchen. escitalopram (LEXAPRO) 20 MG tablet Take 1 tablet (20 mg total) by mouth daily.  . Methylphenidate HCl ER (QUILLIVANT XR) 25 MG/5ML SUSR 12 ML a day total  . phentermine (ADIPEX-P) 37.5 MG tablet Take 1 tablet (37.5 mg total) by mouth daily before breakfast.  . traZODone (DESYREL) 150 MG tablet Take 1 tablet (150 mg total) by mouth daily with breakfast.  . cetirizine (ZYRTEC) 10 MG tablet Take 1 tablet (10 mg total) by mouth daily.  . fluticasone furoate-vilanterol (BREO ELLIPTA) 100-25 MCG/INH AEPB Inhale 1 puff into the lungs daily. Rinse mouth with water after each use   Allergies  Allergen Reactions  . Keflex [Cephalexin] Swelling and Hives   Past Medical History:  Diagnosis Date  . ADD (attention deficit disorder)   . Allergy   . Anxiety   . Asthma   . Depression   . GERD (gastroesophageal reflux disease)   . Gestational diabetes   . Hyperlipidemia   . Infection    UTI  . Obesity   . OCD  (obsessive compulsive disorder)   . Ovarian cyst   . Peripheral neuropathy (HCC)   . Preterm labor   . Type II or unspecified type diabetes mellitus without mention of complication, not stated as uncontrolled    Past Surgical History:  Procedure Laterality Date  . GASTRIC BYPASS  04/2014   [facility]  . OOPHORECTOMY Right 07/2009  . WISDOM TOOTH EXTRACTION     Review of Systems  10 point systems review negative except as above.    Objective:   Physical Exam  BP 140/84   Pulse 92   Temp 97.7 F (36.5 C)   Resp 16   Ht 5' 3.5" (1.613 m)   Wt 236 lb 3.2 oz (107.1 kg)   BMI 41.18 kg/m   Dry cough, no strider.  HEENT - Eac's patent. TM's Nl. EOM's full. PERRLA. (+) tender Rt Maxillary area.  NasoOroPharynx clear. Neck - supple. No bruits, nodes, JVD Chest - Scattered rales & few rhonchi - no wheezes.  Cor - Nl HS. RRR w/o sig M. MS- FROM  Gait Nl. Neuro - Nl w/o focal abnormalities.    Assessment & Plan:   1. Tracheitis   2. Right maxillary sinusitis  - predniSONE (DELTASONE) 20 MG tablet; 1 tab 3 x day for 3 days, then 1 tab 2 x day for 3 days, then 1 tab 1 x day for  5 days  Dispense: 20 tablet; Refill: 0  - azithromycin (ZITHROMAX) 250 MG tablet; Take 2 tablets (500 mg) on  Day 1,  followed by 1 tablet (250 mg) once daily on Days 2 through 5.  Dispense: 6 each; Refill: 1  - benzonatate (TESSALON) 200 MG capsule; Take 1 perle 3 x/day to prevent cough  Dispense: 30 capsule; Refill: 1  - also recc "Mucus Relief" w/ DM 1200 mg bid - discussed meds /SE's

## 2016-10-07 NOTE — Patient Instructions (Signed)

## 2016-11-16 ENCOUNTER — Other Ambulatory Visit: Payer: Self-pay | Admitting: Physician Assistant

## 2016-11-16 NOTE — Telephone Encounter (Signed)
Phentermine was called into pharmacy @ 1:48pm

## 2016-11-19 ENCOUNTER — Encounter: Payer: Self-pay | Admitting: Physician Assistant

## 2016-11-19 DIAGNOSIS — Z0001 Encounter for general adult medical examination with abnormal findings: Secondary | ICD-10-CM

## 2016-11-19 DIAGNOSIS — F32A Depression, unspecified: Secondary | ICD-10-CM

## 2016-11-19 DIAGNOSIS — F329 Major depressive disorder, single episode, unspecified: Secondary | ICD-10-CM

## 2016-11-19 MED ORDER — ALPRAZOLAM 1 MG PO TABS: ORAL_TABLET | ORAL | 1 refills | Status: DC

## 2016-11-19 MED ORDER — BUPROPION HCL ER (XL) 300 MG PO TB24: 300.0000 mg | ORAL_TABLET | ORAL | 0 refills | Status: DC

## 2016-11-19 MED ORDER — ESCITALOPRAM OXALATE 20 MG PO TABS: 20.0000 mg | ORAL_TABLET | Freq: Every day | ORAL | 1 refills | Status: DC

## 2016-11-19 MED ORDER — TRAZODONE HCL 150 MG PO TABS: 150.0000 mg | ORAL_TABLET | Freq: Every day | ORAL | 1 refills | Status: DC

## 2016-11-27 ENCOUNTER — Other Ambulatory Visit: Payer: Self-pay | Admitting: Physician Assistant

## 2016-11-27 DIAGNOSIS — Z0001 Encounter for general adult medical examination with abnormal findings: Secondary | ICD-10-CM

## 2017-01-14 ENCOUNTER — Encounter: Payer: Self-pay | Admitting: Physician Assistant

## 2017-02-05 ENCOUNTER — Other Ambulatory Visit: Payer: Self-pay | Admitting: Physician Assistant

## 2017-02-05 DIAGNOSIS — Z0001 Encounter for general adult medical examination with abnormal findings: Secondary | ICD-10-CM

## 2017-02-10 ENCOUNTER — Encounter: Payer: Self-pay | Admitting: Physician Assistant

## 2017-02-15 NOTE — Progress Notes (Signed)
Complete Physical  Assessment and Plan: Essential hypertension - continue medications, DASH diet, exercise and monitor at home. Call if greater than 130/80.   Hyperlipidemia -continue medications, check lipids, decrease fatty foods, increase activity.    Obesity, morbid  long discussion about weight loss, diet, and exercise, continue weight loss after bypass Phentermine no longer working, will try belviq  Pre-diabetes Better with weight loss  Asthma, unspecified asthma severity, uncomplicated Controlled Get on allergy pills/flonase  Gastroesophageal reflux disease with esophagitis Continue PPI/H2 blocker, diet discussed   Peripheral neuropathy better  Allergy, subsequent encounter Continue OTC allergy pills  Depression - buPROPion (WELLBUTRIN XL) 300 MG 24 hr tablet; Take 1 tablet (300 mg total) by mouth every morning.  Dispense: 90 tablet; Refill: 2 - escitalopram (LEXAPRO) 20 MG tablet; Take 1 tablet (20 mg total) by mouth daily.  Dispense: 90 tablet; Refill: 1 - traZODone (DESYREL) 150 MG tablet; Take 1 tablet (150 mg total) by mouth daily with breakfast.  Dispense: 90 tablet; Refill: 1  ADD (attention deficit disorder) Continue follow up Dr. Elisabeth Most   S/P gastric bypass Down 100 lbs Check labs   Family history of breast cancer Get MGM, negative genetic testing  Hypokalemia - Magnesium - BASIC METABOLIC PANEL WITH GFR List of healthy foods given   Insomnia Trazodone  Discussed med's effects and SE's. Screening labs and tests as requested with regular follow-up as recommended.  HPI  46 y.o. female  presents for a complete physical.  Her blood pressure has been controlled at home, today their BP is BP: 136/84 She does workout, 4-5 days a week at the gym. She denies chest pain, shortness of breath, dizziness.    She is not on cholesterol medication and denies myalgias. Her cholesterol is at goal. The cholesterol last visit was:  Lab Results  Component  Value Date   CHOL 209 (H) 08/10/2016   HDL 46 08/10/2016   LDLCALC 120 08/10/2016   TRIG 214 (H) 08/10/2016   CHOLHDL 4.5 08/10/2016   Lab Results  Component Value Date   CREATININE 0.90 08/10/2016   BUN 14 08/10/2016   NA 141 08/10/2016   K 4.4 08/10/2016   CL 106 08/10/2016   CO2 23 08/10/2016    She has been working on diet and exercise for prediabetes,  and denies polydipsia, polyuria and visual disturbances. Lab Results  Component Value Date   HGBA1C 5.7 (H) 08/10/2016   Patient is on Vitamin D supplement but was decreased due to elevation.  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  She follows with Dr. Elisabeth Most for her ADD.  She has depression, on lexapro and was on wellbutrin. She uses trazodone for sleep.  She had gastric bypass July 17th 2015 with [facility], she has since lost 102 lbs since that time and she is off CPAP, and off her MF since that time. Has been on phentermine however has hit plateau.  BMI is Body mass index is 40.91 kg/m., she is working on diet and exercise. Wt Readings from Last 3 Encounters:  02/17/17 234 lb 9.6 oz (106.4 kg)  10/07/16 236 lb 3.2 oz (107.1 kg)  09/17/16 239 lb 6.4 oz (108.6 kg)    Current Medications:  Current Outpatient Prescriptions on File Prior to Visit  Medication Sig Dispense Refill  . albuterol (VENTOLIN HFA) 108 (90 Base) MCG/ACT inhaler Inhale 2 puffs into the lungs every 4 (four) hours as needed for wheezing or shortness of breath. Make sure you rinse mouth out  after each use. 1 Inhaler 0  . ALPRAZolam (XANAX) 1 MG tablet take 1 tablet by mouth three times a day if needed 90 tablet 1  . buPROPion (WELLBUTRIN XL) 300 MG 24 hr tablet TAKE 1 TABLET EVERY MORNING 90 tablet 0  . calcium citrate-vitamin D (CITRACAL+D) 315-200 MG-UNIT per tablet Take by mouth.    . Cyanocobalamin (VITAMIN B-12) 5000 MCG SUBL Place 1 tablet under the tongue daily.    Marland Kitchen escitalopram (LEXAPRO) 20 MG tablet Take 1 tablet  (20 mg total) by mouth daily. 90 tablet 1  . Methylphenidate HCl ER (QUILLIVANT XR) 25 MG/5ML SUSR 12 ML a day total 180 mL 0  . phentermine (ADIPEX-P) 37.5 MG tablet TAKE ONE TABLET BY MOUTH BEFORE BREAKFAST 30 tablet 2  . traZODone (DESYREL) 150 MG tablet Take 1 tablet (150 mg total) by mouth daily with breakfast. 90 tablet 1   No current facility-administered medications on file prior to visit.    Health Maintenance:   Immunization History  Administered Date(s) Administered  . Influenza Split 08/08/2013  . Td 04/11/2003  . Tdap 02/17/2017   Tetanus: 2004 DUE Pneumovax: M/A Flu vaccine: 2014 Zostavax: N/A LMP: Patient's last menstrual period was 02/13/2017.  normal, boyfriend with vasectomy, she is off BCP  Pap: 2017, never abnormal pap, Dr. Laurene Footman MGM: 06/2016 DEXA: N/A Colonoscopy: N/A EGD: N/A CXR 11/2013 Ct chest 11/2013 CT AB 06/2000 Stress test 11/2013  Last Dental Exam: No Last Eye Exam:  Yes, unknown name  Patient Care Team: Lucky Cowboy, MD as PCP - General (Internal Medicine)  Medical History:  Past Medical History:  Diagnosis Date  . ADD (attention deficit disorder)   . Allergy   . Anxiety   . Asthma   . Depression   . GERD (gastroesophageal reflux disease)   . Gestational diabetes   . Hyperlipidemia   . Infection    UTI  . Obesity   . OCD (obsessive compulsive disorder)   . Ovarian cyst   . Peripheral neuropathy   . Preterm labor   . Type II or unspecified type diabetes mellitus without mention of complication, not stated as uncontrolled    Allergies Allergies  Allergen Reactions  . Keflex [Cephalexin] Swelling and Hives    SURGICAL HISTORY She  has a past surgical history that includes Oophorectomy (Right, 07/2009); Wisdom tooth extraction; and Gastric bypass (04/2014). FAMILY HISTORY Her family history includes Asthma in her son; Breast cancer (age of onset: 50) in her sister; Diabetes in her mother and son; Healthy in her  sister; Heart disease in her mother; Ovarian cancer (age of onset: 39) in her sister; Prostate cancer in her father; Stomach cancer (age of onset: 31) in her brother. SOCIAL HISTORY She  reports that she quit smoking about 2 years ago. Her smoking use included Cigarettes. She smoked 0.25 packs per day. She has never used smokeless tobacco. She reports that she drinks about 2.4 oz of alcohol per week . She reports that she does not use drugs.  Review of Systems: Review of Systems  Constitutional: Negative.   HENT: Positive for congestion. Negative for ear discharge, ear pain, hearing loss, nosebleeds, sore throat and tinnitus.   Eyes: Negative.   Respiratory: Negative for cough, hemoptysis, sputum production, shortness of breath, wheezing and stridor.   Gastrointestinal: Negative.   Genitourinary: Negative.   Musculoskeletal: Negative.   Skin: Positive for rash. Negative for itching.  Neurological: Positive for headaches. Negative for dizziness, tingling, tremors, sensory change, speech change,  focal weakness, seizures and loss of consciousness.  Psychiatric/Behavioral: Negative.     Physical Exam: Estimated body mass index is 40.91 kg/m as calculated from the following:   Height as of this encounter: 5' 3.5" (1.613 m).   Weight as of this encounter: 234 lb 9.6 oz (106.4 kg). BP 136/84   Pulse (!) 112   Temp 97.7 F (36.5 C)   Resp 14   Ht 5' 3.5" (1.613 m)   Wt 234 lb 9.6 oz (106.4 kg)   LMP 02/13/2017   SpO2 97%   BMI 40.91 kg/m  General Appearance: Well nourished, in no apparent distress.  Eyes: PERRLA, EOMs, conjunctiva no swelling or erythema, normal fundi and vessels.  Sinuses: No Frontal/maxillary tenderness  ENT/Mouth: Ext aud canals clear, normal light reflex with TMs without erythema, bulging. Good dentition. No erythema, swelling, or exudate on post pharynx. Tonsils not swollen or erythematous. Hearing normal.  Neck: Supple, thyroid normal. No bruits  Respiratory:  Respiratory effort normal, BS equal bilaterally without rales, rhonchi, wheezing or stridor.  Cardio: RRR without murmurs, rubs or gallops. Brisk peripheral pulses without edema.  Chest: symmetric, with normal excursions and percussion.  Breasts: defer OB/GYN Abdomen: Soft, nontender, no guarding, rebound, hernias, masses, or organomegaly. .  Lymphatics: Non tender without lymphadenopathy.  Genitourinary: defer OB/GYN Musculoskeletal: Full ROM all peripheral extremities,5/5 strength, and normal gait.  Skin: Velvety stuck on non erythematous patches/plaques darker in color than skin along trunk, and neck.  Warm, dry without rashes, lesions, ecchymosis. Neuro: Cranial nerves intact, reflexes equal bilaterally. Normal muscle tone, no cerebellar symptoms. Sensation intact.  Psych: Awake and oriented X 3, normal affect, Insight and Judgment appropriate.   EKG: wnl AORTA SCAN: N/A  Quentin Mulling 9:41 AM [facility]

## 2017-02-17 ENCOUNTER — Encounter: Payer: Self-pay | Admitting: Physician Assistant

## 2017-02-17 ENCOUNTER — Ambulatory Visit (INDEPENDENT_AMBULATORY_CARE_PROVIDER_SITE_OTHER): Payer: BLUE CROSS/BLUE SHIELD | Admitting: Physician Assistant

## 2017-02-17 VITALS — BP 136/84 | HR 112 | Temp 97.7°F | Resp 14 | Ht 63.5 in | Wt 234.6 lb

## 2017-02-17 DIAGNOSIS — Z0000 Encounter for general adult medical examination without abnormal findings: Secondary | ICD-10-CM

## 2017-02-17 DIAGNOSIS — Z79899 Other long term (current) drug therapy: Secondary | ICD-10-CM | POA: Diagnosis not present

## 2017-02-17 DIAGNOSIS — R7303 Prediabetes: Secondary | ICD-10-CM

## 2017-02-17 DIAGNOSIS — K2100 Gastro-esophageal reflux disease with esophagitis, without bleeding: Secondary | ICD-10-CM

## 2017-02-17 DIAGNOSIS — Z6841 Body Mass Index (BMI) 40.0 and over, adult: Secondary | ICD-10-CM

## 2017-02-17 DIAGNOSIS — E785 Hyperlipidemia, unspecified: Secondary | ICD-10-CM

## 2017-02-17 DIAGNOSIS — I10 Essential (primary) hypertension: Secondary | ICD-10-CM | POA: Diagnosis not present

## 2017-02-17 DIAGNOSIS — Z23 Encounter for immunization: Secondary | ICD-10-CM | POA: Diagnosis not present

## 2017-02-17 DIAGNOSIS — F429 Obsessive-compulsive disorder, unspecified: Secondary | ICD-10-CM

## 2017-02-17 DIAGNOSIS — F3341 Major depressive disorder, recurrent, in partial remission: Secondary | ICD-10-CM

## 2017-02-17 DIAGNOSIS — E559 Vitamin D deficiency, unspecified: Secondary | ICD-10-CM | POA: Diagnosis not present

## 2017-02-17 DIAGNOSIS — Z803 Family history of malignant neoplasm of breast: Secondary | ICD-10-CM

## 2017-02-17 DIAGNOSIS — F909 Attention-deficit hyperactivity disorder, unspecified type: Secondary | ICD-10-CM

## 2017-02-17 DIAGNOSIS — Z136 Encounter for screening for cardiovascular disorders: Secondary | ICD-10-CM | POA: Diagnosis not present

## 2017-02-17 DIAGNOSIS — F064 Anxiety disorder due to known physiological condition: Secondary | ICD-10-CM

## 2017-02-17 DIAGNOSIS — J45909 Unspecified asthma, uncomplicated: Secondary | ICD-10-CM

## 2017-02-17 DIAGNOSIS — F410 Panic disorder [episodic paroxysmal anxiety] without agoraphobia: Secondary | ICD-10-CM

## 2017-02-17 DIAGNOSIS — Z0001 Encounter for general adult medical examination with abnormal findings: Secondary | ICD-10-CM

## 2017-02-17 DIAGNOSIS — G6289 Other specified polyneuropathies: Secondary | ICD-10-CM

## 2017-02-17 DIAGNOSIS — D649 Anemia, unspecified: Secondary | ICD-10-CM

## 2017-02-17 DIAGNOSIS — Z9884 Bariatric surgery status: Secondary | ICD-10-CM

## 2017-02-17 MED ORDER — LORCASERIN HCL ER 20 MG PO TB24: 20.0000 mg | ORAL_TABLET | Freq: Every day | ORAL | 3 refills | Status: DC

## 2017-02-17 NOTE — Patient Instructions (Addendum)
Here are things you can do to help with this: - Try the Flonase or Nasonex. Remember to spray each nostril twice towards the outer part of your eye.  Do not sniff but instead pinch your nose and tilt your head back to help the medicine get into your sinuses.  The best time to do this is at bedtime.Stop if you get blurred vision or nose bleeds.   -Please pick one of the over the counter allergy medications below and take it once daily for allergies.  It will also help with fluid behind ear drums. Claritin or loratadine cheapest but likely the weakest  Zyrtec or certizine at night because it can make you sleepy The strongest is allegra or fexafinadine  Cheapest at [employer], [employer], [employer] -can use decongestant over the counter, please do not use if you have high blood pressure or certain heart conditions.   if worsening HA, changes vision/speech, imbalance, weakness go to the ER  Get the belviq coupon online If this is not covered we can try contrave  Who Qualifies for Obesity Medications? Although everyone is hopeful for a fast and easy way to lose weight, nothing has been shown to replace a prudent, calorie-controlled diet along with behavior modification as a cornerstone for all obesity treatments.  The next tool that can be used to achieve weight-loss and health improvement is medication. Pharmacotherapy may be offered to individuals affected by obesity who have failed to achieve weight-loss through diet and exercise alone. Currently there are several drugs that are approved by the FDA for weight-loss: phentermine products (Adipex-P or Suprenza)  lorcaserin HCI (Belviq) phentermine- topiramate ER (Qsymia)  Bupropion; Naltrexone ER (Contrave)  Let's take a closer look at each of these medications and learn how they work:  Phentermine (Adipex-P or Suprenza) How does it work? Phentermine is a medication available by prescription that works on chemicals in the brain to decrease your  appetite. It also has a mild stimulant component that adds extra energy. Phentermine is a pill that is taken once a day in the morning time. Tolerance to this medication can develop, so it can only be used for several months at a time. Common side effects are dry mouth, sleeplessness, constipation. Weight-loss: The average weight-loss is 4-5 percent of your weight after one-year. In a 200 pound person, this means about 10 pounds of weight-loss. Patients who receive phentermine can usually expect to see greater weight-loss than those who receive non-pharmacologic care, on average about 13 pounds difference over 12 weeks as reported in one study. Concerns: Due to its stimulant effect, a person's blood pressure and heart rate may increase when on this medication; therefore, you must be monitored closely by a physician who is experienced in prescribing this medication. It cannot be used in patients with some heart conditions (such as poorly controlled blood pressure), glaucoma (increased pressure in your eye), stroke or overactive thyroid. There is some concern for abuse, but this is minimal if the medication is appropriately used as directed by a healthcare professional.  Lorcaserin (Belviq) How does it work? Lorcaserin was approved in June 2012 by the FDA and became commercially available in June 2013. It works by helping you feel full while eating less, and it works on the chemicals in your brain to help decrease your appetite. Weight-loss: In individuals who took the medication for one-year, it has been shown to have an average of 7 percent weight-loss. In a 200 pound person, this would mean a 14 pound weight-loss. Blood  sugar, cholesterol and blood pressure levels have also been shown to improve. Concerns: The most common side effects are headache, dizziness, fatigue, dry mouth, upper NWG:NFAOZHYQ tract infection and nausea.  Response to therapy should be evaluated by week 12.  If a patient has not lost  at least 5% of baseline body weigh  Phentermine-Topiramate ER (Qsymia) How does it work? This combination medication was approved by the FDA in July 2012. Topiramate is a medication used to treat seizures. It was found that a common side effect of this medication was weight-loss. Phentermine, as described in this brochure, helps to increase your energy and decrease your appetite. Weight-loss: Among individuals who took the highest does of Qsymia (15 mg phentermine and 92 mg of topiramate ER) for one-year, they achieved an average of 14.4 percent weight-loss. In a 200 pound person, a 14.4 percent weight-loss would mean a loss of 29 pounds. Cholesterol levels have also been shown to improve. Concerns: The most common side effects were dry mouth, constipation and pins-and-needle feeling in extremities. Qsymia should NOT be taken during pregnancy since Topiramate ER, a component of Qsymia, has been associated with an increased risk of birth defects.  Bupropion; Naltrexone ER (Contrave) How does it work? Works in two areas of your brain, hunger center and reward center to reduce hunger and cravings.  Weight loss In a 56 week trial patients lost more than 5% of their body weight.  Concerns Most common side effects are dry mouth, constipation or diarrhea, headache.  Please take it with a full glass of water and low fat meal.    Follow-up Visits: Patients are given the opportunity to revisit a topic or obtain more information on an area of interest during follow-up visits. The frequency of and interval between follow-up visits is determined on a patient-by-patient basis. Frequent visits (every 3 to 4 weeks) are encouraged until initial weight-loss goals (5 to 10 percent of body weight) are achieved. At that point, less frequent visits are typically scheduled as needed for individual patients. However, since obesity is considered a chronic life-long problem for many individuals, periodic continual  follow up is recommended.   Research has shown that weight-loss as low as 5 percent of initial body weight can lead to favorable improvements in blood pressure, cholesterol, glucose levels and insulin sensitivity. The risk of developing heart disease is reduced the most in patients who have impaired glucose tolerance, type 2 diabetes or high blood pressure.   Before you even begin to attack a weight-loss plan, it pays to remember this: You are not fat. You have fat. Losing weight isn't about blame or shame; it's simply another achievement to accomplish. Dieting is like any other skill-you have to buckle down and work at it. As long as you act in a smart, reasonable way, you'll ultimately get where you want to be. Here are some weight loss pearls for you.  1. It's Not a Diet. It's a Lifestyle Thinking of a diet as something you're on and suffering through only for the short term doesn't work. To shed weight and keep it off, you need to make permanent changes to the way you eat. It's OK to indulge occasionally, of course, but if you cut calories temporarily and then revert to your old way of eating, you'll gain back the weight quicker than you can say yo-yo. Use it to lose it. Research shows that one of the best predictors of long-term weight loss is how many pounds you drop in the  first month. For that reason, nutritionists often suggest being stricter for the first two weeks of your new eating strategy to build momentum. Cut out added sugar and alcohol and avoid unrefined carbs. After that, figure out how you can reincorporate them in a way that's healthy and maintainable.  2. There's a Right Way to Exercise Working out burns calories and fat and boosts your metabolism by building muscle. But those trying to lose weight are notorious for overestimating the number of calories they burn and underestimating the amount they take in. Unfortunately, your system is biologically programmed to hold on to extra  pounds and that means when you start exercising, your body senses the deficit and ramps up its hunger signals. If you're not diligent, you'll eat everything you burn and then some. Use it to lose it. Cardio gets all the exercise glory, but strength and interval training are the real heroes. They help you build lean muscle, which in turn increases your metabolism and calorie-burning ability 3. Don't Overreact to Mild Hunger Some people have a hard time losing weight because of hunger anxiety. To them, being hungry is bad-something to be avoided at all costs-so they carry snacks with them and eat when they don't need to. Others eat because they're stressed out or bored. While you never want to get to the point of being ravenous (that's when bingeing is likely to happen), a hunger pang, a craving, or the fact that it's 3:00 p.m. should not send you racing for the vending machine or obsessing about the energy bar in your purse. Ideally, you should put off eating until your stomach is growling and it's difficult to concentrate.  Use it to lose it. When you feel the urge to eat, use the HALT method. Ask yourself, Am I really hungry? Or am I angry or anxious, lonely or bored, or tired? If you're still not certain, try the apple test. If you're truly hungry, an apple should seem delicious; if it doesn't, something else is going on. Or you can try drinking water and making yourself busy, if you are still hungry try a healthy snack.  4. Not All Calories Are Created Equal The mechanics of weight loss are pretty simple: Take in fewer calories than you use for energy. But the kind of food you eat makes all the difference. Processed food that's high in saturated fat and refined starch or sugar can cause inflammation that disrupts the hormone signals that tell your brain you're full. The result: You eat a lot more.  Use it to lose it. Clean up your diet. Swap in whole, unprocessed foods, including vegetables, lean protein,  and healthy fats that will fill you up and give you the biggest nutritional bang for your calorie buck. In a few weeks, as your brain starts receiving regular hunger and fullness signals once again, you'll notice that you feel less hungry overall and naturally start cutting back on the amount you eat.  5. Protein, Produce, and Plant-Based Fats Are Your Weight-Loss Trinity Here's why eating the three Ps regularly will help you drop pounds. Protein fills you up. You need it to build lean muscle, which keeps your metabolism humming so that you can torch more fat. People in a weight-loss program who ate double the recommended daily allowance for protein (about 110 grams for a 150-pound woman) lost 70 percent of their weight from fat, while people who ate the RDA lost only about 40 percent, one study found. Produce is packed with  filling fiber. "It's very difficult to consume too many calories if you're eating a lot of vegetables. Example: Three cups of broccoli is a lot of food, yet only 93 calories. (Fruit is another story. It can be easy to overeat and can contain a lot of calories from sugar, so be sure to monitor your intake.) Plant-based fats like olive oil and those in avocados and nuts are healthy and extra satiating.  Use it to lose it. Aim to incorporate each of the three Ps into every meal and snack. People who eat protein throughout the day are able to keep weight off, according to a study in the American Journal of Clinical Nutrition. In addition to meat, poultry and seafood, good sources are beans, lentils, eggs, tofu, and yogurt. As for fat, keep portion sizes in check by measuring out salad dressing, oil, and nut butters (shoot for one to two tablespoons). Finally, eat veggies or a little fruit at every meal. People who did that consumed 308 fewer calories but didn't feel any hungrier than when they didn't eat more produce.  7. How You Eat Is As Important As What You Eat In order for your brain  to register that you're full, you need to focus on what you're eating. Sit down whenever you eat, preferably at a table. Turn off the TV or computer, put down your phone, and look at your food. Smell it. Chew slowly, and don't put another bite on your fork until you swallow. When women ate lunch this attentively, they consumed 30 percent less when snacking later than those who listened to an audiobook at lunchtime, according to a study in the [place] Journal of Nutrition. 8. Weighing Yourself Really Works The scale provides the best evidence about whether your efforts are paying off. Seeing the numbers tick up or down or stagnate is motivation to keep going-or to rethink your approach. A 2015 study at [facility] found that daily weigh-ins helped people lose more weight, keep it off, and maintain that loss, even after two years. Use it to lose it. Step on the scale at the same time every day for the best results. If your weight shoots up several pounds from one weigh-in to the next, don't freak out. Eating a lot of salt the night before or having your period is the likely culprit. The number should return to normal in a day or two. It's a steady climb that you need to do something about. 9. Too Much Stress and Too Little Sleep Are Your Enemies When you're tired and frazzled, your body cranks up the production of cortisol, the stress hormone that can cause carb cravings. Not getting enough sleep also boosts your levels of ghrelin, a hormone associated with hunger, while suppressing leptin, a hormone that signals fullness and satiety. People on a diet who slept only five and a half hours a night for two weeks lost 55 percent less fat and were hungrier than those who slept eight and a half hours, according to a study in the [place] [employer] Journal. Use it to lose it. Prioritize sleep, aiming for seven hours or more a night, which research shows helps lower stress. And make sure you're getting  quality zzz's. If a snoring spouse or a fidgety cat wakes you up frequently throughout the night, you may end up getting the equivalent of just four hours of sleep, according to a study from [facility]. Keep pets out of the bedroom, and use a white-noise app  to drown out snoring. 10. You Will Hit a plateau-And You Can Bust Through It As you slim down, your body releases much less leptin, the fullness hormone.  If you're not strength training, start right now. Building muscle can raise your metabolism to help you overcome a plateau. To keep your body challenged and burning calories, incorporate new moves and more intense intervals into your workouts or add another sweat session to your weekly routine. Alternatively, cut an extra 100 calories or so a day from your diet. Now that you've lost weight, your body simply doesn't need as much fuel.    Simple math prevails.    1st - exercise does not produce significant weight loss - at best one converts fat into muscle , "bulks up", loses inches, but usually stays "weight neutral"     2nd - think of your body weightas a check book: If you eat more calories than you burn up - you save money or gain weight .... Or if you spend more money than you put in the check book, ie burn up more calories than you eat, then you lose weight     3rd - if you walk or run 1 mile, you burn up 100 calories - you have to burn up 3,500 calories to lose 1 pound, ie you have to walk/run 35 miles to lose 1 measly pound. So if you want to lose 10 #, then you have to walk/run 350 miles, so.... clearly exercise is not the solution.     4. So if you consume 1,500 calories, then you have to burn up the equivalent of 15 miles to stay weight neutral - It also stands to reason that if you consume 1,500 cal/day and don't lose weight, then you must be burning up about 1,500 cals/day to stay weight neutral.     5. If you really want to lose weight, you must cut your calorie intake 300  calories /day and at that rate you should lose about 1 # every 3 days.   6. Please purchase Dr Francis Dowse Fuhrman's book(s) "The End of Dieting" & "Eat to Live" . It has some great concepts and recipes.

## 2017-02-18 LAB — CBC WITH DIFFERENTIAL/PLATELET
BASOS PCT: 1 %
Basophils Absolute: 59 cells/uL (ref 0–200)
EOS PCT: 2 %
Eosinophils Absolute: 118 cells/uL (ref 15–500)
HCT: 39.9 % (ref 35.0–45.0)
HEMOGLOBIN: 13.1 g/dL (ref 11.7–15.5)
LYMPHS ABS: 2773 {cells}/uL (ref 850–3900)
Lymphocytes Relative: 47 %
MCH: 27.8 pg (ref 27.0–33.0)
MCHC: 32.8 g/dL (ref 32.0–36.0)
MCV: 84.7 fL (ref 80.0–100.0)
MPV: 10.2 fL (ref 7.5–12.5)
Monocytes Absolute: 295 cells/uL (ref 200–950)
Monocytes Relative: 5 %
NEUTROS ABS: 2655 {cells}/uL (ref 1500–7800)
NEUTROS PCT: 45 %
Platelets: 331 10*3/uL (ref 140–400)
RBC: 4.71 MIL/uL (ref 3.80–5.10)
RDW: 13.8 % (ref 11.0–15.0)
WBC: 5.9 10*3/uL (ref 3.8–10.8)

## 2017-02-18 LAB — IRON AND TIBC
%SAT: 34 % (ref 11–50)
Iron: 105 ug/dL (ref 40–190)
TIBC: 305 ug/dL (ref 250–450)
UIBC: 200 ug/dL (ref 125–400)

## 2017-02-18 LAB — BASIC METABOLIC PANEL WITH GFR
BUN: 12 mg/dL (ref 7–25)
CHLORIDE: 101 mmol/L (ref 98–110)
CO2: 27 mmol/L (ref 20–31)
CREATININE: 0.94 mg/dL (ref 0.50–1.10)
Calcium: 9.3 mg/dL (ref 8.6–10.2)
GFR, Est African American: 85 mL/min (ref >=60)
GFR, Est Non African American: 74 mL/min (ref >=60)
Glucose, Bld: 87 mg/dL (ref 65–99)
POTASSIUM: 4.5 mmol/L (ref 3.5–5.3)
Sodium: 138 mmol/L (ref 135–146)

## 2017-02-18 LAB — URINALYSIS, ROUTINE W REFLEX MICROSCOPIC
Bilirubin Urine: NEGATIVE
Glucose, UA: NEGATIVE
HGB URINE DIPSTICK: NEGATIVE
KETONES UR: NEGATIVE
LEUKOCYTES UA: NEGATIVE
Nitrite: NEGATIVE
PH: 6.5 (ref 5.0–8.0)
PROTEIN: NEGATIVE
Specific Gravity, Urine: 1.036 — ABNORMAL HIGH (ref 1.001–1.035)

## 2017-02-18 LAB — HEPATIC FUNCTION PANEL
ALBUMIN: 4 g/dL (ref 3.6–5.1)
ALT: 16 U/L (ref 6–29)
AST: 17 U/L (ref 10–30)
Alkaline Phosphatase: 33 U/L (ref 33–115)
Bilirubin, Direct: 0.1 mg/dL (ref <=0.2)
Indirect Bilirubin: 0.4 mg/dL (ref 0.2–1.2)
TOTAL PROTEIN: 6.9 g/dL (ref 6.1–8.1)
Total Bilirubin: 0.5 mg/dL (ref 0.2–1.2)

## 2017-02-18 LAB — MICROALBUMIN / CREATININE URINE RATIO
CREATININE, URINE: 272 mg/dL (ref 20–320)
Microalb Creat Ratio: 1 mcg/mg creat (ref <30)
Microalb, Ur: 0.3 mg/dL

## 2017-02-18 LAB — FERRITIN: FERRITIN: 100 ng/mL (ref 10–232)

## 2017-02-18 LAB — LIPID PANEL
Cholesterol: 200 mg/dL — ABNORMAL HIGH (ref <200)
HDL: 53 mg/dL (ref >50)
LDL CALC: 124 mg/dL — AB (ref <100)
Total CHOL/HDL Ratio: 3.8 Ratio (ref <5.0)
Triglycerides: 113 mg/dL (ref <150)
VLDL: 23 mg/dL (ref <30)

## 2017-02-18 LAB — VITAMIN D 25 HYDROXY (VIT D DEFICIENCY, FRACTURES): Vit D, 25-Hydroxy: 43 ng/mL (ref 30–100)

## 2017-02-18 LAB — TSH: TSH: 0.82 m[IU]/L

## 2017-02-18 LAB — FOLATE RBC: RBC FOLATE: 795 ng/mL (ref >280)

## 2017-02-18 LAB — MAGNESIUM: MAGNESIUM: 2.1 mg/dL (ref 1.5–2.5)

## 2017-02-18 LAB — VITAMIN B12: Vitamin B-12: 1468 pg/mL — ABNORMAL HIGH (ref 200–1100)

## 2017-02-25 ENCOUNTER — Other Ambulatory Visit: Payer: Self-pay | Admitting: Physician Assistant

## 2017-02-25 ENCOUNTER — Encounter: Payer: Self-pay | Admitting: Physician Assistant

## 2017-02-25 MED ORDER — NALTREXONE-BUPROPION HCL ER 8-90 MG PO TB12: ORAL_TABLET | ORAL | 1 refills | Status: DC

## 2017-04-26 ENCOUNTER — Encounter: Payer: Self-pay | Admitting: Physician Assistant

## 2017-04-26 ENCOUNTER — Other Ambulatory Visit: Payer: Self-pay | Admitting: Physician Assistant

## 2017-04-26 MED ORDER — ALPRAZOLAM 1 MG PO TABS: ORAL_TABLET | ORAL | 1 refills | Status: DC

## 2017-04-26 NOTE — Progress Notes (Signed)
Xanax rx was faxed to mail order pharmacy on 9th July 2018 by dd

## 2017-06-10 ENCOUNTER — Encounter: Payer: Self-pay | Admitting: Physician Assistant

## 2017-06-10 DIAGNOSIS — F32A Depression, unspecified: Secondary | ICD-10-CM

## 2017-06-10 DIAGNOSIS — F329 Major depressive disorder, single episode, unspecified: Secondary | ICD-10-CM

## 2017-06-10 DIAGNOSIS — Z0001 Encounter for general adult medical examination with abnormal findings: Secondary | ICD-10-CM

## 2017-06-10 MED ORDER — PHENTERMINE HCL 37.5 MG PO TABS: ORAL_TABLET | ORAL | 0 refills | Status: DC

## 2017-06-10 MED ORDER — BUPROPION HCL ER (XL) 300 MG PO TB24: 300.0000 mg | ORAL_TABLET | Freq: Every morning | ORAL | 0 refills | Status: DC

## 2017-06-10 MED ORDER — ESCITALOPRAM OXALATE 20 MG PO TABS: 20.0000 mg | ORAL_TABLET | Freq: Every day | ORAL | 1 refills | Status: DC

## 2017-07-14 ENCOUNTER — Other Ambulatory Visit: Payer: Self-pay | Admitting: Physician Assistant

## 2017-07-15 NOTE — Telephone Encounter (Signed)
Phentermine called into pharmacy on 27th sept 2018 by DD

## 2017-07-21 NOTE — Progress Notes (Signed)
Assessment and Plan:  Hypertension -Continue medication, monitor blood pressure at home. Continue DASH diet.  Reminder to go to the ER if any CP, SOB, nausea, dizziness, severe HA, changes vision/speech, left arm numbness and tingling and jaw pain.  Cholesterol -Continue diet and exercise. Check cholesterol.   Prediabetes  -Continue diet and exercise. Check A1C  Vitamin D Def - check level and continue medications.   Obesity with co morbidities s/p gastric bypass - long discussion about weight loss, diet, and exercise - continue phentermine- follow up 1 month- if no weight loss will take off  Anxiety Decreased need to cut back on the amount of xanax she is receiving It is addictive medications, need to try to take AS needed and sparingly Suggest CBT therapy   Future Appointments Date Time Provider Department Center  08/27/2017 9:00 AM Quentin Mulling, PA-C [facility] None  02/21/2018 9:00 AM Quentin Mulling, PA-C [facility] None    Continue diet and meds as discussed. Further disposition pending results of labs. Over 30 minutes of exam, counseling, chart review, and critical decision making was performed  HPI 46 y.o. female  presents for 3 month follow up on hypertension, cholesterol, prediabetes, and vitamin D deficiency.   Her blood pressure has been controlled at home, today their BP is BP: 128/78  She does workout, 1 hour of cardio and 30 mins weight training 4 days a week.  She denies chest pain, shortness of breath, dizziness.  She is on symbicort for asthma and allergies.   She is not on cholesterol medication and denies myalgias. Her cholesterol is at goal. The cholesterol last visit was:   She has been working on diet and exercise for prediabetes, and denies paresthesia of the feet, polydipsia, polyuria and visual disturbances. Last A1C in the office was:  Lab Results  Component Value Date   HGBA1C 5.7 (H) 08/10/2016   Patient is on Vitamin D supplement,    Lab  Results  Component Value Date   VD25OH 43 02/17/2017    She has depression and ADD follows with Dr. Elisabeth Most.  BMI is Body mass index is 42.06 kg/m., she is working on diet and exercise, and she is s/p gastric bypass on July 17th 2015.  She just started phentermine x 1 month.  Wt Readings from Last 09 Encounters:  07/22/17 241 lb 3.2 oz (109.4 kg)  02/17/17 234 lb 9.6 oz (106.4 kg)  10/07/16 236 lb 3.2 oz (107.1 kg)  09/17/16 239 lb 6.4 oz (108.6 kg)  08/10/16 256 lb 12.8 oz (116.5 kg)  06/19/16 (P) 256 lb 9.6 oz (116.4 kg)  02/11/16 249 lb 9.6 oz (113.2 kg)  12/27/15 245 lb (111.1 kg)  09/18/15 234 lb (106.1 kg)    Current Medications:  Current Outpatient Prescriptions on File Prior to Visit  Medication Sig Dispense Refill  . albuterol (VENTOLIN HFA) 108 (90 Base) MCG/ACT inhaler Inhale 2 puffs into the lungs every 4 (four) hours as needed for wheezing or shortness of breath. Make sure you rinse mouth out after each use. 1 Inhaler 0  . ALPRAZolam (XANAX) 1 MG tablet take 1 tablet by mouth three times a day if needed 90 tablet 1  . buPROPion (WELLBUTRIN XL) 300 MG 24 hr tablet Take 1 tablet (300 mg total) by mouth every morning. 90 tablet 0  . calcium citrate-vitamin D (CITRACAL+D) 315-200 MG-UNIT per tablet Take by mouth.    . Cyanocobalamin (VITAMIN B-12) 5000 MCG SUBL Place 1 tablet under the tongue daily.    Marland Kitchen  escitalopram (LEXAPRO) 20 MG tablet Take 1 tablet (20 mg total) by mouth daily. 90 tablet 1  . Methylphenidate HCl ER (QUILLIVANT XR) 25 MG/5ML SUSR 12 ML a day total 180 mL 0  . phentermine (ADIPEX-P) 37.5 MG tablet TAKE 1 TABLET BEFORE BREAKFAST 30 tablet 0  . traZODone (DESYREL) 150 MG tablet Take 1 tablet (150 mg total) by mouth daily with breakfast. 90 tablet 1   No current facility-administered medications on file prior to visit.    Medical History:  Past Medical History:  Diagnosis Date  . ADD (attention deficit disorder)   . Allergy   . Anxiety   . Asthma    . Depression   . GERD (gastroesophageal reflux disease)   . Gestational diabetes   . Hyperlipidemia   . Infection    UTI  . Obesity   . OCD (obsessive compulsive disorder)   . Ovarian cyst   . Peripheral neuropathy   . Preterm labor   . Type II or unspecified type diabetes mellitus without mention of complication, not stated as uncontrolled    Allergies:  Allergies  Allergen Reactions  . Keflex [Cephalexin] Swelling and Hives     Review of Systems:  Review of Systems  Constitutional: Negative.   HENT: Negative for congestion, ear discharge, ear pain, hearing loss, nosebleeds and sore throat.   Eyes: Negative.   Respiratory: Negative.  Negative for stridor.   Cardiovascular: Negative.   Gastrointestinal: Negative for abdominal pain, blood in stool, constipation, diarrhea, heartburn, melena, nausea and vomiting.  Genitourinary: Negative.   Musculoskeletal: Negative.   Skin: Negative.   Neurological: Negative.  Negative for headaches.  Endo/Heme/Allergies: Negative.   Psychiatric/Behavioral: Negative.     Family history- Review and unchanged Social history- Review and unchanged Physical Exam: BP 128/78   Pulse (!) 103   Temp 97.7 F (36.5 C)   Resp 18   Ht 5' 3.5" (1.613 m)   Wt 241 lb 3.2 oz (109.4 kg)   SpO2 98%   BMI 42.06 kg/m  Wt Readings from Last 3 Encounters:  07/22/17 241 lb 3.2 oz (109.4 kg)  02/17/17 234 lb 9.6 oz (106.4 kg)  10/07/16 236 lb 3.2 oz (107.1 kg)   General Appearance: Well nourished, in no apparent distress. Eyes: PERRLA, EOMs, conjunctiva no swelling or erythema Sinuses: No Frontal/maxillary tenderness ENT/Mouth: Ext aud canals clear, TMs without erythema, bulging. No erythema, swelling, or exudate on post pharynx.  Tonsils not swollen or erythematous. Hearing normal.  Neck: Supple, thyroid normal.  Respiratory: Respiratory effort normal, BS equal bilaterally without rales, rhonchi, wheezing or stridor.  Cardio: RRR with no MRGs.  Brisk peripheral pulses without edema.  Abdomen: Soft, + BS,  Non tender, no guarding, rebound, hernias, masses. Lymphatics: Non tender without lymphadenopathy.  Musculoskeletal: Full ROM, 5/5 strength, Normal gait Skin: Warm, dry without rashes, lesions, ecchymosis.  Neuro: Cranial nerves intact. Normal muscle tone, no cerebellar symptoms. Psych: Awake and oriented X 3, normal affect, Insight and Judgment appropriate.    Quentin Mulling, PA-C 8:52 AM [facility]

## 2017-07-22 ENCOUNTER — Ambulatory Visit (INDEPENDENT_AMBULATORY_CARE_PROVIDER_SITE_OTHER): Admitting: Physician Assistant

## 2017-07-22 VITALS — BP 128/78 | HR 103 | Temp 97.7°F | Resp 18 | Ht 63.5 in | Wt 241.2 lb

## 2017-07-22 DIAGNOSIS — J45909 Unspecified asthma, uncomplicated: Secondary | ICD-10-CM | POA: Diagnosis not present

## 2017-07-22 DIAGNOSIS — Z9884 Bariatric surgery status: Secondary | ICD-10-CM

## 2017-07-22 DIAGNOSIS — G6289 Other specified polyneuropathies: Secondary | ICD-10-CM | POA: Diagnosis not present

## 2017-07-22 DIAGNOSIS — F3341 Major depressive disorder, recurrent, in partial remission: Secondary | ICD-10-CM

## 2017-07-22 DIAGNOSIS — I10 Essential (primary) hypertension: Secondary | ICD-10-CM

## 2017-07-22 DIAGNOSIS — E785 Hyperlipidemia, unspecified: Secondary | ICD-10-CM

## 2017-07-22 MED ORDER — METHYLPHENIDATE 25.9 MG PO TBED
25.9000 mg | EXTENDED_RELEASE_TABLET | Freq: Every morning | ORAL | 0 refills | Status: DC
Start: 2017-07-22 — End: 2018-09-09

## 2017-07-22 MED ORDER — ALPRAZOLAM 1 MG PO TABS: ORAL_TABLET | ORAL | 1 refills | Status: DC

## 2017-07-22 NOTE — Patient Instructions (Signed)
Simple math prevails.    1st - exercise does not produce significant weight loss - at best one converts fat into muscle , "bulks up", loses inches, but usually stays "weight neutral"     2nd - think of your body weightas a check book: If you eat more calories than you burn up - you save money or gain weight .... Or if you spend more money than you put in the check book, ie burn up more calories than you eat, then you lose weight     3rd - if you walk or run 1 mile, you burn up 100 calories - you have to burn up 3,500 calories to lose 1 pound, ie you have to walk/run 35 miles to lose 1 measly pound. So if you want to lose 10 #, then you have to walk/run 350 miles, so.... clearly exercise is not the solution.     4. So if you consume 1,500 calories, then you have to burn up the equivalent of 15 miles to stay weight neutral - It also stands to reason that if you consume 1,500 cal/day and don't lose weight, then you must be burning up about 1,500 cals/day to stay weight neutral.     5. If you really want to lose weight, you must cut your calorie intake 300 calories /day and at that rate you should lose about 1 # every 3 days.   6. Please purchase Dr Joel Fuhrman's book(s) "The End of Dieting" & "Eat to Live" . It has some great concepts and recipes.        Bad carbs also include fruit juice, alcohol, and sweet tea. These are empty calories that do not signal to your brain that you are full.   Please remember the good carbs are still carbs which convert into sugar. So please measure them out no more than 1/2-1 cup of rice, oatmeal, pasta, and beans  Veggies are however free foods! Pile them on.   Not all fruit is created equal. Please see the list below, the fruit at the bottom is higher in sugars than the fruit at the top. Please avoid all dried fruits.     

## 2017-08-03 ENCOUNTER — Encounter: Payer: Self-pay | Admitting: Physician Assistant

## 2017-08-12 ENCOUNTER — Encounter: Payer: Self-pay | Admitting: Physician Assistant

## 2017-08-12 MED ORDER — PHENTERMINE HCL 37.5 MG PO TABS: ORAL_TABLET | ORAL | 1 refills | Status: DC

## 2017-08-27 ENCOUNTER — Ambulatory Visit: Payer: Self-pay | Admitting: Physician Assistant

## 2017-09-23 ENCOUNTER — Ambulatory Visit: Payer: Self-pay | Admitting: Physician Assistant

## 2017-11-24 ENCOUNTER — Encounter: Payer: Self-pay | Admitting: Physician Assistant

## 2017-11-25 ENCOUNTER — Other Ambulatory Visit: Payer: Self-pay | Admitting: Internal Medicine

## 2017-11-25 DIAGNOSIS — Z0001 Encounter for general adult medical examination with abnormal findings: Secondary | ICD-10-CM

## 2017-11-25 DIAGNOSIS — F329 Major depressive disorder, single episode, unspecified: Secondary | ICD-10-CM

## 2017-11-25 DIAGNOSIS — F32A Depression, unspecified: Secondary | ICD-10-CM

## 2017-11-25 MED ORDER — ALPRAZOLAM 1 MG PO TABS: ORAL_TABLET | ORAL | 0 refills | Status: DC

## 2017-11-25 MED ORDER — BUPROPION HCL ER (XL) 300 MG PO TB24: 300.0000 mg | ORAL_TABLET | Freq: Every morning | ORAL | 0 refills | Status: DC

## 2017-11-25 MED ORDER — ESCITALOPRAM OXALATE 20 MG PO TABS
20.0000 mg | ORAL_TABLET | Freq: Every day | ORAL | 0 refills | Status: DC
Start: 2017-11-25 — End: 2017-12-18

## 2017-12-18 ENCOUNTER — Other Ambulatory Visit: Payer: Self-pay | Admitting: Physician Assistant

## 2017-12-18 DIAGNOSIS — Z0001 Encounter for general adult medical examination with abnormal findings: Secondary | ICD-10-CM

## 2017-12-18 DIAGNOSIS — F329 Major depressive disorder, single episode, unspecified: Secondary | ICD-10-CM

## 2017-12-18 DIAGNOSIS — F32A Depression, unspecified: Secondary | ICD-10-CM

## 2017-12-19 ENCOUNTER — Other Ambulatory Visit: Payer: Self-pay | Admitting: Internal Medicine

## 2017-12-19 DIAGNOSIS — Z0001 Encounter for general adult medical examination with abnormal findings: Secondary | ICD-10-CM

## 2018-02-21 ENCOUNTER — Encounter: Payer: Self-pay | Admitting: Physician Assistant

## 2018-02-23 ENCOUNTER — Other Ambulatory Visit: Payer: Self-pay | Admitting: Internal Medicine

## 2018-02-23 DIAGNOSIS — Z0001 Encounter for general adult medical examination with abnormal findings: Secondary | ICD-10-CM

## 2018-02-23 DIAGNOSIS — F329 Major depressive disorder, single episode, unspecified: Secondary | ICD-10-CM

## 2018-02-23 DIAGNOSIS — F32A Depression, unspecified: Secondary | ICD-10-CM

## 2018-03-25 ENCOUNTER — Ambulatory Visit: Payer: Self-pay | Admitting: Adult Health

## 2018-03-27 NOTE — Progress Notes (Signed)
Assessment and Plan:  Bridget Watson was seen today for sinusitis and headache.  Diagnoses and all orders for this visit:  Allergic sinusitis - Discussed the importance of avoiding unnecessary antibiotic therapy. Suggested symptomatic OTC remedies. Nasal saline spray for congestion. Nasal steroids, allergy pill, oral steroids Follow up as needed. Call back if severe pain, fever/chills and will start on abx treatment -     predniSONE (DELTASONE) 20 MG tablet; 2 tablets daily for 3 days, 1 tablet daily for 4 days.  Further disposition pending results of labs. Discussed med's effects and SE's.   Over 15 minutes of exam, counseling, chart review, and critical decision making was performed.   No future appointments.  ------------------------------------------------------------------------------------------------------------------  HPI BP 126/82   Pulse 87   Temp (!) 97.5 F (36.4 C)   Ht 5' 3.5" (1.613 m)   Wt 260 lb (117.9 kg)   SpO2 98%   BMI 45.33 kg/m   47 y.o.female with hx of asthma presents for evaluation of URI symptoms; sinus headaches (achy, intermittent ~6/10, takes ibuprofen which typically does help resolve), facial pressure and postnasal drip. Symptoms ongoing ~2 weeks. She does endorse rare cough, nonproductive. She does endorse hx of allergies and currently not taking anything. Denies fever/chills, sore throat, rash, N/V/D, dyspnea, wheezing, dizziness, chest pain, LE edema. She has been using nettie pot without improvement.    Past Medical History:  Diagnosis Date  . ADD (attention deficit disorder)   . Allergy   . Anxiety   . Asthma   . Depression   . GERD (gastroesophageal reflux disease)   . Gestational diabetes   . Hyperlipidemia   . Infection    UTI  . Obesity   . OCD (obsessive compulsive disorder)   . Ovarian cyst   . Peripheral neuropathy   . Preterm labor   . Type II or unspecified type diabetes mellitus without mention of complication, not stated as  uncontrolled      Allergies  Allergen Reactions  . Keflex [Cephalexin] Swelling and Hives    Current Outpatient Medications on File Prior to Visit  Medication Sig  . calcium citrate-vitamin D (CITRACAL+D) 315-200 MG-UNIT per tablet Take by mouth.  . Cyanocobalamin (VITAMIN B-12) 5000 MCG SUBL Place 1 tablet under the tongue daily.  . Methylphenidate (COTEMPLA XR-ODT) 25.9 MG TBED Take 25.9 mg by mouth every morning.  . Methylphenidate HCl ER (QUILLIVANT XR) 25 MG/5ML SUSR 12 ML a day total   No current facility-administered medications on file prior to visit.     ROS: Review of Systems  Constitutional: Negative for chills, diaphoresis, fever and malaise/fatigue.  HENT: Positive for congestion. Negative for ear discharge, ear pain, hearing loss, sinus pain, sore throat and tinnitus.   Eyes: Negative for blurred vision, pain, discharge and redness.  Respiratory: Negative for cough, hemoptysis, sputum production, shortness of breath, wheezing and stridor.   Cardiovascular: Negative for chest pain, palpitations and orthopnea.  Gastrointestinal: Negative for abdominal pain, diarrhea, nausea and vomiting.  Genitourinary: Negative.   Musculoskeletal: Negative for joint pain and myalgias.  Skin: Negative for rash.  Neurological: Positive for headaches. Negative for dizziness, sensory change and weakness.  Endo/Heme/Allergies: Positive for environmental allergies.  Psychiatric/Behavioral: Negative.   All other systems reviewed and are negative.    Physical Exam:  BP 126/82   Pulse 87   Temp (!) 97.5 F (36.4 C)   Ht 5' 3.5" (1.613 m)   Wt 260 lb (117.9 kg)   SpO2 98%   BMI 45.33  kg/m   General Appearance: Well nourished, in no apparent distress. Eyes: PERRLA, EOMs, conjunctiva no swelling or erythema Sinuses: No notable frontal/maxillary tenderness ENT/Mouth: Ext aud canals clear, TMs without erythema, bulging. No erythema, swelling, or exudate on post pharynx.  Tonsils not  swollen or erythematous. Hearing normal.  Neck: Supple, thyroid normal.  Respiratory: Respiratory effort normal, BS equal bilaterally without rales, rhonchi, wheezing or stridor.  Cardio: RRR with no MRGs. Brisk peripheral pulses without edema.  Abdomen: Soft, + BS.  Non tender. Lymphatics: Non tender without lymphadenopathy.  Musculoskeletal: Symemtrical strength, normal gait.  Skin: Warm, dry without rashes, lesions, ecchymosis.  Neuro: Cranial nerves intact. Normal muscle tone, no cerebellar symptoms. Sensation intact.  Psych: Awake and oriented X 3, normal affect, Insight and Judgment appropriate.     Dan MakerAshley C Jurnie Garritano, NP 3:01 PM [facility]

## 2018-03-28 ENCOUNTER — Ambulatory Visit (INDEPENDENT_AMBULATORY_CARE_PROVIDER_SITE_OTHER): Admitting: Adult Health

## 2018-03-28 ENCOUNTER — Encounter: Payer: Self-pay | Admitting: Adult Health

## 2018-03-28 VITALS — BP 126/82 | HR 87 | Temp 97.5°F | Ht 63.5 in | Wt 260.0 lb

## 2018-03-28 DIAGNOSIS — Z0001 Encounter for general adult medical examination with abnormal findings: Secondary | ICD-10-CM

## 2018-03-28 DIAGNOSIS — F329 Major depressive disorder, single episode, unspecified: Secondary | ICD-10-CM

## 2018-03-28 DIAGNOSIS — J309 Allergic rhinitis, unspecified: Secondary | ICD-10-CM

## 2018-03-28 DIAGNOSIS — F32A Depression, unspecified: Secondary | ICD-10-CM

## 2018-03-28 MED ORDER — ALBUTEROL SULFATE HFA 108 (90 BASE) MCG/ACT IN AERS: 2.0000 | INHALATION_SPRAY | RESPIRATORY_TRACT | 0 refills | Status: AC | PRN

## 2018-03-28 MED ORDER — PHENTERMINE HCL 37.5 MG PO TABS: ORAL_TABLET | ORAL | 2 refills | Status: DC

## 2018-03-28 MED ORDER — TRAZODONE HCL 150 MG PO TABS: 150.0000 mg | ORAL_TABLET | Freq: Every day | ORAL | 1 refills | Status: DC

## 2018-03-28 MED ORDER — ESCITALOPRAM OXALATE 20 MG PO TABS: 20.0000 mg | ORAL_TABLET | Freq: Every day | ORAL | 0 refills | Status: DC

## 2018-03-28 MED ORDER — PREDNISONE 20 MG PO TABS: ORAL_TABLET | ORAL | 0 refills | Status: DC

## 2018-03-28 MED ORDER — BUPROPION HCL ER (XL) 300 MG PO TB24: ORAL_TABLET | ORAL | 0 refills | Status: DC

## 2018-03-28 MED ORDER — ALPRAZOLAM 1 MG PO TABS: ORAL_TABLET | ORAL | 0 refills | Status: DC

## 2018-03-28 NOTE — Patient Instructions (Addendum)
Allegra, zyrtec, claritin -   Flonase - fluticasone   HOW TO TREAT VIRAL COUGH AND COLD SYMPTOMS:  -Symptoms usually last at least 1 week with the worst symptoms being around day 4.  - colds usually start with a sore throat and end with a cough, and the cough can take 2 weeks to get better.  -No antibiotics are needed for colds, flu, sore throats, cough, bronchitis UNLESS symptoms are longer than 7 days OR if you are getting better then get drastically worse.  -There are a lot of combination medications (Dayquil, Nyquil, Vicks 44, tyelnol cold and sinus, ETC). Please look at the ingredients on the back so that you are treating the correct symptoms and not doubling up on medications/ingredients.    Medicines you can use  Nasal congestion  Little Remedies saline spray (aerosol/mist)- can try this, it is in the kids section - pseudoephedrine (Sudafed)- behind the counter, do not use if you have high blood pressure, medicine that have -D in them.  - phenylephrine (Sudafed PE) -Dextormethorphan + chlorpheniramine (Coridcidin HBP)- okay if you have high blood pressure -Oxymetazoline (Afrin) nasal spray- LIMIT to 3 days -Saline nasal spray -Neti pot (used distilled or bottled water)  Ear pain/congestion  -pseudoephedrine (sudafed) - Nasonex/flonase nasal spray  Fever  -Acetaminophen (Tyelnol) -Ibuprofen (Advil, motrin, aleve)  Sore Throat  -Acetaminophen (Tyelnol) -Ibuprofen (Advil, motrin, aleve) -Drink a lot of water -Gargle with salt water - Rest your voice (don't talk) -Throat sprays -Cough drops  Body Aches  -Acetaminophen (Tyelnol) -Ibuprofen (Advil, motrin, aleve)  Headache  -Acetaminophen (Tyelnol) -Ibuprofen (Advil, motrin, aleve) - Exedrin, Exedrin Migraine  Allergy symptoms (cough, sneeze, runny nose, itchy eyes) -Claritin or loratadine cheapest but likely the weakest  -Zyrtec or certizine at night because it can make you sleepy -The strongest is allegra or  fexafinadine  Cheapest at [employer], [employer], [employer]  Cough  -Dextromethorphan (Delsym)- medicine that has DM in it -Guafenesin (Mucinex/Robitussin) - cough drops - drink lots of water  Chest Congestion  -Guafenesin (Mucinex/Robitussin)  Red Itchy Eyes  - Naphcon-A  Upset Stomach  - Bland diet (nothing spicy, greasy, fried, and high acid foods like tomatoes, oranges, berries) -OKAY- cereal, bread, soup, crackers, rice -Eat smaller more frequent meals -reduce caffeine, no alcohol -Loperamide (Imodium-AD) if diarrhea -Prevacid for heart burn  General health when sick  -Hydration -wash your hands frequently -keep surfaces clean -change pillow cases and sheets often -Get fresh air but do not exercise strenuously -Vitamin D, double up on it - Vitamin C -Zinc

## 2018-05-18 ENCOUNTER — Ambulatory Visit: Payer: Self-pay | Admitting: Adult Health

## 2018-06-06 NOTE — Progress Notes (Signed)
FOLLOW UP  Assessment and Plan:   Hypertension At goal at this time Monitor blood pressure at home; patient to call if consistently greater than 130/80 Continue DASH diet.   Reminder to go to the ER if any CP, SOB, nausea, dizziness, severe HA, changes vision/speech, left arm numbness and tingling and jaw pain.  Cholesterol Currently above goal; working on lifestyle Continue low cholesterol diet and exercise.  Check lipid panel.   Prediabetes Continue diet and exercise.  Perform daily foot/skin check, notify office of any concerning changes.  Check A1C  Morbid Obesity with co morbidities Long discussion about weight loss, diet, and exercise Recommended diet heavy in fruits and veggies and low in animal meats, cheeses, and dairy products, appropriate calorie intake Discussed ideal weight for height  Patient will work on eating more regularly during the day, at least 3 meals, emphasized regular routine, protein+healthy fat+fiber Patient on phentermine but ? adversely effecting stress/anxiety at this time; advised to hold for now Will follow up in 3 months  Vitamin D Def Continue supplementation Check vitamin D level  Depression/anxiety Wants to try switching lexapro; suggested zoloft - start 100 mg daily, follow up 12 weeks or sooner if needed Continue medications; reminded to limit use of xanax  Lifestyle discussed: diet/exerise, sleep hygiene, stress management, hydration  ADD Follow up with Dr. Elisabeth Most Continue medications: methylphenidate Helps with focus, no AE's. The patient was counseled on the addictive nature of the medication and was encouraged to take drug holidays when not needed.   Continue diet and meds as discussed. Further disposition pending results of labs. Discussed med's effects and SE's.   Over 30 minutes of exam, counseling, chart review, and critical decision making was performed.   No future  appointments.  ----------------------------------------------------------------------------------------------------------------------  HPI 47 y.o. female  presents for 6 month follow up on hypertension, cholesterol, prediabetes, morbid obesity, depression/anxiety/ADD and vitamin D deficiency.   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   she has a diagnosis of major depression with anxiety/panic disorder,  (manages ADD), and is currently on lexapro 20 mg, wellbutrin 300 mg, trazodone 150 mg (admits not taking), xanax 1 mg BID PRN, reports symptoms are poorly controlled at this time current regimen and would like to try zoloft after discussion. she currently takes 2 a day due to elevated stress/anxiety. She also has ADD and is prescribed methylphenidate XR managed by Dr. Elisabeth Most   she is morbidly obese, s/p gastric bypass in 2015 is prescribed phentermine for weight loss.  While on the medication they have lost 1 lbs since last visit. She endorses increased anxiety and difficulty sleeping at night. Discussed and will stop until stress calms down.   BMI is Body mass index is 45.16 kg/m., she is working on diet and exercise. Wt Readings from Last 3 Encounters:  06/07/18 259 lb (117.5 kg)  03/28/18 260 lb (117.9 kg)  07/22/17 241 lb 3.2 oz (109.4 kg)   Her blood pressure has been controlled at home, today their BP is BP: 124/84  She does workout. She denies chest pain, shortness of breath, dizziness.   She is not on cholesterol medication and denies myalgias. Her cholesterol is not at goal. The cholesterol last visit was:   Lab Results  Component Value Date   CHOL 200 (H) 02/17/2017   HDL 53 02/17/2017   LDLCALC 124 (H) 02/17/2017   TRIG 113 02/17/2017   CHOLHDL 3.8 02/17/2017    She has been working  on diet and exercise for prediabetes, and denies foot ulcerations, increased appetite, nausea, paresthesia of the feet, polydipsia, polyuria, visual  disturbances, vomiting and weight loss. Last A1C in the office was:  Lab Results  Component Value Date   HGBA1C 5.7 (H) 08/10/2016   Patient is on Vitamin D supplement.   Lab Results  Component Value Date   VD25OH 43 02/17/2017        Current Medications:  Current Outpatient Medications on File Prior to Visit  Medication Sig  . albuterol (VENTOLIN HFA) 108 (90 Base) MCG/ACT inhaler Inhale 2 puffs into the lungs every 4 (four) hours as needed for wheezing or shortness of breath. Make sure you rinse mouth out after each use.  Marland Kitchen. ALPRAZolam (XANAX) 1 MG tablet take 1 tablet by mouth two times a day if needed for panic attack, try not to take daily, this medication is addictive.  Marland Kitchen. buPROPion (WELLBUTRIN XL) 300 MG 24 hr tablet Take one tablet by mouth daily  . calcium citrate-vitamin D (CITRACAL+D) 315-200 MG-UNIT per tablet Take by mouth.  . Cyanocobalamin (VITAMIN B-12) 5000 MCG SUBL Place 1 tablet under the tongue daily.  . Methylphenidate (COTEMPLA XR-ODT) 25.9 MG TBED Take 25.9 mg by mouth every morning.  . phentermine (ADIPEX-P) 37.5 MG tablet TAKE 1 TABLET BEFORE BREAKFAST  . predniSONE (DELTASONE) 20 MG tablet 2 tablets daily for 3 days, 1 tablet daily for 4 days.  . traZODone (DESYREL) 150 MG tablet Take 1 tablet (150 mg total) by mouth daily with breakfast.   No current facility-administered medications on file prior to visit.      Allergies:  Allergies  Allergen Reactions  . Keflex [Cephalexin] Swelling and Hives     Medical History:  Past Medical History:  Diagnosis Date  . ADD (attention deficit disorder)   . Allergy   . Anxiety   . Asthma   . Depression   . GERD (gastroesophageal reflux disease)   . Gestational diabetes   . Hyperlipidemia   . Infection    UTI  . Obesity   . OCD (obsessive compulsive disorder)   . Ovarian cyst   . Peripheral neuropathy   . Preterm labor   . Type II or unspecified type diabetes mellitus without mention of complication, not  stated as uncontrolled    Family history- Reviewed and unchanged Social history- Reviewed and unchanged   Review of Systems:  Review of Systems  Constitutional: Negative for malaise/fatigue and weight loss.  HENT: Negative for hearing loss and tinnitus.   Eyes: Negative for blurred vision and double vision.  Respiratory: Negative for cough, shortness of breath and wheezing.   Cardiovascular: Negative for chest pain, palpitations, orthopnea, claudication and leg swelling.  Gastrointestinal: Negative for abdominal pain, blood in stool, constipation, diarrhea, heartburn, melena, nausea and vomiting.  Genitourinary: Negative.   Musculoskeletal: Negative for joint pain and myalgias.  Skin: Negative for rash.  Neurological: Negative for dizziness, tingling, sensory change, weakness and headaches.  Endo/Heme/Allergies: Negative for polydipsia.  Psychiatric/Behavioral: Positive for depression. The patient is nervous/anxious and has insomnia.   All other systems reviewed and are negative.    Physical Exam: BP 124/84   Pulse 96   Temp (!) 97.3 F (36.3 C)   Ht 5' 3.5" (1.613 m)   Wt 259 lb (117.5 kg)   SpO2 99%   BMI 45.16 kg/m  Wt Readings from Last 3 Encounters:  06/07/18 259 lb (117.5 kg)  03/28/18 260 lb (117.9 kg)  07/22/17 241 lb  3.2 oz (109.4 kg)   General Appearance: Well nourished, in no apparent distress. Eyes: PERRLA, EOMs, conjunctiva no swelling or erythema Sinuses: No Frontal/maxillary tenderness ENT/Mouth: Ext aud canals clear, TMs without erythema, bulging. No erythema, swelling, or exudate on post pharynx.  Tonsils not swollen or erythematous. Hearing normal.  Neck: Supple, thyroid normal.  Respiratory: Respiratory effort normal, BS equal bilaterally without rales, rhonchi, wheezing or stridor.  Cardio: RRR with no MRGs. Brisk peripheral pulses without edema.  Abdomen: Soft, + BS.  Non tender, no guarding, rebound, hernias, masses. Lymphatics: Non tender without  lymphadenopathy.  Musculoskeletal: Full ROM, 5/5 strength, Normal gait Skin: Warm, dry without rashes, lesions, ecchymosis.  Neuro: Cranial nerves intact. No cerebellar symptoms.  Psych: Awake and oriented X 3, normal affect, Insight and Judgment appropriate.    Dan MakerAshley C Jaecion Dempster, NP 3:21 PM [facility]

## 2018-06-07 ENCOUNTER — Encounter: Payer: Self-pay | Admitting: Adult Health

## 2018-06-07 ENCOUNTER — Ambulatory Visit (INDEPENDENT_AMBULATORY_CARE_PROVIDER_SITE_OTHER): Admitting: Adult Health

## 2018-06-07 VITALS — BP 124/84 | HR 96 | Temp 97.3°F | Ht 63.5 in | Wt 259.0 lb

## 2018-06-07 DIAGNOSIS — F410 Panic disorder [episodic paroxysmal anxiety] without agoraphobia: Secondary | ICD-10-CM

## 2018-06-07 DIAGNOSIS — Z0001 Encounter for general adult medical examination with abnormal findings: Secondary | ICD-10-CM

## 2018-06-07 DIAGNOSIS — F32A Depression, unspecified: Secondary | ICD-10-CM

## 2018-06-07 DIAGNOSIS — E785 Hyperlipidemia, unspecified: Secondary | ICD-10-CM | POA: Diagnosis not present

## 2018-06-07 DIAGNOSIS — R7303 Prediabetes: Secondary | ICD-10-CM

## 2018-06-07 DIAGNOSIS — F3341 Major depressive disorder, recurrent, in partial remission: Secondary | ICD-10-CM

## 2018-06-07 DIAGNOSIS — F429 Obsessive-compulsive disorder, unspecified: Secondary | ICD-10-CM

## 2018-06-07 DIAGNOSIS — F329 Major depressive disorder, single episode, unspecified: Secondary | ICD-10-CM

## 2018-06-07 DIAGNOSIS — F909 Attention-deficit hyperactivity disorder, unspecified type: Secondary | ICD-10-CM

## 2018-06-07 DIAGNOSIS — Z79899 Other long term (current) drug therapy: Secondary | ICD-10-CM

## 2018-06-07 DIAGNOSIS — I10 Essential (primary) hypertension: Secondary | ICD-10-CM | POA: Diagnosis not present

## 2018-06-07 DIAGNOSIS — E559 Vitamin D deficiency, unspecified: Secondary | ICD-10-CM

## 2018-06-07 DIAGNOSIS — Z6841 Body Mass Index (BMI) 40.0 and over, adult: Secondary | ICD-10-CM

## 2018-06-07 DIAGNOSIS — F064 Anxiety disorder due to known physiological condition: Secondary | ICD-10-CM

## 2018-06-07 MED ORDER — TRAZODONE HCL 150 MG PO TABS: 150.0000 mg | ORAL_TABLET | Freq: Every day | ORAL | 1 refills | Status: AC

## 2018-06-07 MED ORDER — BUPROPION HCL ER (XL) 300 MG PO TB24: ORAL_TABLET | ORAL | 0 refills | Status: DC

## 2018-06-07 MED ORDER — SERTRALINE HCL 100 MG PO TABS: 100.0000 mg | ORAL_TABLET | Freq: Every day | ORAL | 2 refills | Status: DC

## 2018-06-07 NOTE — Patient Instructions (Signed)
Aim to eat on a regular schedule - minimum 3 meals a day, snacks if needed after having regular meal  Remember: fiber + protein + healthy fat = satisfying  Minimum of 1200-1500 calories daily  Avoid empty calories (sweet drinks, simple carbs)  65-80+ fluid ounces of water or unsweet tea for healthy kidneys  Avoid highly processed foods,  Aim for low stress - take time to unwind and care for your mental health  Aim for 150 min of moderate intensity exercise weekly for heart health, and weights twice weekly for bone health  Aim for 7-9 hours of sleep daily    Drink 1/2 your body weight in fluid ounces of water daily; drink a tall glass of water 30 min before meals  Don't eat until you're stuffed- listen to your stomach and eat until you are 80% full   Try eating off of a salad plate; wait 10 min after finishing before going back for seconds  Start by eating the vegetables on your plate; aim for 50% of your meals to be fruits or vegetables  Then eat your protein - lean meats (grass fed if possible), fish, beans, nuts in moderation  Eat your carbs/starch last ONLY if you still are hungry. If you can, stop before finishing it all  Avoid sugar and flour - the closer it looks to it's original form in nature, typically the better it is for you  Splurge in moderation - "assign" days when you get to splurge and have the "bad stuff" - I like to follow a 80% - 20% plan- "good" choices 80 % of the time, "bad" choices in moderation 20% of the time  Simple equation is: Calories out > calories in = weight loss - even if you eat the bad stuff, if you limit portions, you will still lose weight    Stress and Stress Management Stress is a normal reaction to life events. It is what you feel when life demands more than you are used to or more than you can handle. Some stress can be useful. For example, the stress reaction can help you catch the last bus of the day, study for a test, or meet a  deadline at work. But stress that occurs too often or for too long can cause problems. It can affect your emotional health and interfere with relationships and normal daily activities. Too much stress can weaken your immune system and increase your risk for physical illness. If you already have a medical problem, stress can make it worse. What are the causes? All sorts of life events may cause stress. An event that causes stress for one person may not be stressful for another person. Major life events commonly cause stress. These may be positive or negative. Examples include losing your job, moving into a new home, getting married, having a baby, or losing a loved one. Less obvious life events may also cause stress, especially if they occur day after day or in combination. Examples include working long hours, driving in traffic, caring for children, being in debt, or being in a difficult relationship. What are the signs or symptoms? Stress may cause emotional symptoms including, the following:  Anxiety. This is feeling worried, afraid, on edge, overwhelmed, or out of control.  Anger. This is feeling irritated or impatient.  Depression. This is feeling sad, down, helpless, or guilty.  Difficulty focusing, remembering, or making decisions.  Stress may cause physical symptoms, including the following:  Aches and pains. These may  affect your head, neck, back, stomach, or other areas of your body.  Tight muscles or clenched jaw.  Low energy or trouble sleeping.  Stress may cause unhealthy behaviors, including the following:  Eating to feel better (overeating) or skipping meals.  Sleeping too little, too much, or both.  Working too much or putting off tasks (procrastination).  Smoking, drinking alcohol, or using drugs to feel better.  How is this diagnosed? Stress is diagnosed through an assessment by your health care provider. Your health care provider will ask questions about your  symptoms and any stressful life events.Your health care provider will also ask about your medical history and may order blood tests or other tests. Certain medical conditions and medicine can cause physical symptoms similar to stress. Mental illness can cause emotional symptoms and unhealthy behaviors similar to stress. Your health care provider may refer you to a mental health professional for further evaluation. How is this treated? Stress management is the recommended treatment for stress.The goals of stress management are reducing stressful life events and coping with stress in healthy ways. Techniques for reducing stressful life events include the following:  Stress identification. Self-monitor for stress and identify what causes stress for you. These skills may help you to avoid some stressful events.  Time management. Set your priorities, keep a calendar of events, and learn to say "no." These tools can help you avoid making too many commitments.  Techniques for coping with stress include the following:  Rethinking the problem. Try to think realistically about stressful events rather than ignoring them or overreacting. Try to find the positives in a stressful situation rather than focusing on the negatives.  Exercise. Physical exercise can release both physical and emotional tension. The key is to find a form of exercise you enjoy and do it regularly.  Relaxation techniques. These relax the body and mind. Examples include yoga, meditation, tai chi, biofeedback, deep breathing, progressive muscle relaxation, listening to music, being out in nature, journaling, and other hobbies. Again, the key is to find one or more that you enjoy and can do regularly.  Healthy lifestyle. Eat a balanced diet, get plenty of sleep, and do not smoke. Avoid using alcohol or drugs to relax.  Strong support network. Spend time with family, friends, or other people you enjoy being around.Express your feelings  and talk things over with someone you trust.  Counseling or talktherapy with a mental health professional may be helpful if you are having difficulty managing stress on your own. Medicine is typically not recommended for the treatment of stress.Talk to your health care provider if you think you need medicine for symptoms of stress. Follow these instructions at home:  Keep all follow-up visits as directed by your health care provider.  Take all medicines as directed by your health care provider. Contact a health care provider if:  Your symptoms get worse or you start having new symptoms.  You feel overwhelmed by your problems and can no longer manage them on your own. Get help right away if:  You feel like hurting yourself or someone else. This information is not intended to replace advice given to you by your health care provider. Make sure you discuss any questions you have with your health care provider. Document Released: 03/31/2001 Document Revised: 03/12/2016 Document Reviewed: 05/30/2013 Elsevier Interactive Patient Education  2017 [place] relief/deep breathing exercise Deep breath in through your nose, and slow exhale through your mouth over 7 seconds. Imagine all the  muscles in your body relaxing as you exhale. Repeat a few times.   Repeat as needed throughout day      Please be aware that some of the medications that you are on can sometimes cause a rare and potentially dangerous adverse reaction, called SEROTONIN SYNDROME: Symptoms of this condition include (but are not limited to):  Agitation or restlessness, confusion, rapid heart rate and high blood pressure, dilated pupils, loss of muscle coordination or twitching muscles, muscle rigidity/stiffness, sweating and/or flushing, diarrhea, headache, shivering, goose bumps. If you have any of these symptoms you may have to stop the medication. Call your health care provider immediately.  Severe serotonin  syndrome can be life-threatening emergency. Signs and symptoms of a severe reaction may include: high fever, seizures, irregular heartbeat, unconsciousness or altered level of awareness or personality changes.  If you have any of these new symptoms, call 911 or have someone take you to the emergency room.     Sertraline tablets What is this medicine? SERTRALINE (SER tra leen) is used to treat depression. It may also be used to treat obsessive compulsive disorder, panic disorder, post-trauma stress, premenstrual dysphoric disorder (PMDD) or social anxiety. This medicine may be used for other purposes; ask your health care provider or pharmacist if you have questions. COMMON BRAND NAME(S): Zoloft What should I tell my health care provider before I take this medicine? They need to know if you have any of these conditions: -bleeding disorders -bipolar disorder or a family history of bipolar disorder -glaucoma -heart disease -high blood pressure -history of irregular heartbeat -history of low levels of calcium, magnesium, or potassium in the blood -if you often drink alcohol -liver disease -receiving electroconvulsive therapy -seizures -suicidal thoughts, plans, or attempt; a previous suicide attempt by you or a family member -take medicines that treat or prevent blood clots -thyroid disease -an unusual or allergic reaction to sertraline, other medicines, foods, dyes, or preservatives -pregnant or trying to get pregnant -breast-feeding How should I use this medicine? Take this medicine by mouth with a glass of water. Follow the directions on the prescription label. You can take it with or without food. Take your medicine at regular intervals. Do not take your medicine more often than directed. Do not stop taking this medicine suddenly except upon the advice of your doctor. Stopping this medicine too quickly may cause serious side effects or your condition may worsen. A special MedGuide will  be given to you by the pharmacist with each prescription and refill. Be sure to read this information carefully each time. Talk to your pediatrician regarding the use of this medicine in children. While this drug may be prescribed for children as young as 7 years for selected conditions, precautions do apply. Overdosage: If you think you have taken too much of this medicine contact a poison control center or emergency room at once. NOTE: This medicine is only for you. Do not share this medicine with others. What if I miss a dose? If you miss a dose, take it as soon as you can. If it is almost time for your next dose, take only that dose. Do not take double or extra doses. What may interact with this medicine? Do not take this medicine with any of the following medications: -cisapride -dofetilide -dronedarone -linezolid -MAOIs like Carbex, Eldepryl, Marplan, Nardil, and Parnate -methylene blue (injected into a vein) -pimozide -thioridazine This medicine may also interact with the following medications: -alcohol -amphetamines -aspirin and aspirin-like medicines -certain medicines for depression,  anxiety, or psychotic disturbances -certain medicines for fungal infections like ketoconazole, fluconazole, posaconazole, and itraconazole -certain medicines for irregular heart beat like flecainide, quinidine, propafenone -certain medicines for migraine headaches like almotriptan, eletriptan, frovatriptan, naratriptan, rizatriptan, sumatriptan, zolmitriptan -certain medicines for sleep -certain medicines for seizures like carbamazepine, valproic acid, phenytoin -certain medicines that treat or prevent blood clots like warfarin, enoxaparin, dalteparin -cimetidine -digoxin -diuretics -fentanyl -isoniazid -lithium -NSAIDs, medicines for pain and inflammation, like ibuprofen or naproxen -other medicines that prolong the QT interval (cause an abnormal heart  rhythm) -rasagiline -safinamide -supplements like St. John's wort, kava kava, valerian -tolbutamide -tramadol -tryptophan This list may not describe all possible interactions. Give your health care provider a list of all the medicines, herbs, non-prescription drugs, or dietary supplements you use. Also tell them if you smoke, drink alcohol, or use illegal drugs. Some items may interact with your medicine. What should I watch for while using this medicine? Tell your doctor if your symptoms do not get better or if they get worse. Visit your doctor or health care professional for regular checks on your progress. Because it may take several weeks to see the full effects of this medicine, it is important to continue your treatment as prescribed by your doctor. Patients and their families should watch out for new or worsening thoughts of suicide or depression. Also watch out for sudden changes in feelings such as feeling anxious, agitated, panicky, irritable, hostile, aggressive, impulsive, severely restless, overly excited and hyperactive, or not being able to sleep. If this happens, especially at the beginning of treatment or after a change in dose, call your health care professional. Dennis Bast may get drowsy or dizzy. Do not drive, use machinery, or do anything that needs mental alertness until you know how this medicine affects you. Do not stand or sit up quickly, especially if you are an older patient. This reduces the risk of dizzy or fainting spells. Alcohol may interfere with the effect of this medicine. Avoid alcoholic drinks. Your mouth may get dry. Chewing sugarless gum or sucking hard candy, and drinking plenty of water may help. Contact your doctor if the problem does not go away or is severe. What side effects may I notice from receiving this medicine? Side effects that you should report to your doctor or health care professional as soon as possible: -allergic reactions like skin rash, itching or  hives, swelling of the face, lips, or tongue -anxious -black, tarry stools -changes in vision -confusion -elevated mood, decreased need for sleep, racing thoughts, impulsive behavior -eye pain -fast, irregular heartbeat -feeling faint or lightheaded, falls -feeling agitated, angry, or irritable -hallucination, loss of contact with reality -loss of balance or coordination -loss of memory -painful or prolonged erections -restlessness, pacing, inability to keep still -seizures -stiff muscles -suicidal thoughts or other mood changes -trouble sleeping -unusual bleeding or bruising -unusually weak or tired -vomiting Side effects that usually do not require medical attention (report to your doctor or health care professional if they continue or are bothersome): -change in appetite or weight -change in sex drive or performance -diarrhea -increased sweating -indigestion, nausea -tremors This list may not describe all possible side effects. Call your doctor for medical advice about side effects. You may report side effects to FDA at 1-800-FDA-1088. Where should I keep my medicine? Keep out of the reach of children. Store at room temperature between 15 and 30 degrees C (59 and 86 degrees F). Throw away any unused medicine after the expiration date. NOTE: This sheet  is a summary. It may not cover all possible information. If you have questions about this medicine, talk to your doctor, pharmacist, or health care provider.  2018 Elsevier/Gold Standard (2016-10-09 14:17:49)

## 2018-06-08 LAB — CBC WITH DIFFERENTIAL/PLATELET
Basophils Absolute: 41 cells/uL (ref 0–200)
Basophils Relative: 0.7 %
EOS PCT: 1.7 %
Eosinophils Absolute: 100 cells/uL (ref 15–500)
HEMATOCRIT: 38.7 % (ref 35.0–45.0)
Hemoglobin: 12.8 g/dL (ref 11.7–15.5)
LYMPHS ABS: 2661 {cells}/uL (ref 850–3900)
MCH: 27.6 pg (ref 27.0–33.0)
MCHC: 33.1 g/dL (ref 32.0–36.0)
MCV: 83.4 fL (ref 80.0–100.0)
MPV: 11.5 fL (ref 7.5–12.5)
Monocytes Relative: 5.1 %
NEUTROS ABS: 2797 {cells}/uL (ref 1500–7800)
NEUTROS PCT: 47.4 %
PLATELETS: 278 10*3/uL (ref 140–400)
RBC: 4.64 10*6/uL (ref 3.80–5.10)
RDW: 13.2 % (ref 11.0–15.0)
Total Lymphocyte: 45.1 %
WBC mixed population: 301 cells/uL (ref 200–950)
WBC: 5.9 10*3/uL (ref 3.8–10.8)

## 2018-06-08 LAB — LIPID PANEL
CHOL/HDL RATIO: 3.9 (calc) (ref <5.0)
Cholesterol: 209 mg/dL — ABNORMAL HIGH (ref <200)
HDL: 53 mg/dL (ref >50)
LDL Cholesterol (Calc): 134 mg/dL (calc) — ABNORMAL HIGH
NON-HDL CHOLESTEROL (CALC): 156 mg/dL — AB (ref <130)
TRIGLYCERIDES: 113 mg/dL (ref <150)

## 2018-06-08 LAB — COMPLETE METABOLIC PANEL WITH GFR
AG Ratio: 1.5 (calc) (ref 1.0–2.5)
ALT: 16 U/L (ref 6–29)
AST: 18 U/L (ref 10–35)
Albumin: 4.1 g/dL (ref 3.6–5.1)
Alkaline phosphatase (APISO): 38 U/L (ref 33–115)
BUN: 12 mg/dL (ref 7–25)
CALCIUM: 9.2 mg/dL (ref 8.6–10.2)
CO2: 28 mmol/L (ref 20–32)
Chloride: 103 mmol/L (ref 98–110)
Creat: 0.96 mg/dL (ref 0.50–1.10)
GFR, EST NON AFRICAN AMERICAN: 71 mL/min/{1.73_m2} (ref >=60)
GFR, Est African American: 83 mL/min/{1.73_m2} (ref >=60)
GLOBULIN: 2.8 g/dL (ref 1.9–3.7)
Glucose, Bld: 88 mg/dL (ref 65–99)
Potassium: 4.6 mmol/L (ref 3.5–5.3)
Sodium: 139 mmol/L (ref 135–146)
Total Bilirubin: 0.3 mg/dL (ref 0.2–1.2)
Total Protein: 6.9 g/dL (ref 6.1–8.1)

## 2018-06-08 LAB — TSH: TSH: 0.98 m[IU]/L

## 2018-06-08 LAB — HEMOGLOBIN A1C
Hgb A1c MFr Bld: 6 % of total Hgb — ABNORMAL HIGH (ref <5.7)
Mean Plasma Glucose: 126 (calc)
eAG (mmol/L): 7 (calc)

## 2018-06-21 ENCOUNTER — Other Ambulatory Visit: Payer: Self-pay | Admitting: Internal Medicine

## 2018-06-21 ENCOUNTER — Other Ambulatory Visit: Payer: Self-pay | Admitting: Adult Health

## 2018-06-21 MED ORDER — SERTRALINE HCL 100 MG PO TABS: ORAL_TABLET | ORAL | 0 refills | Status: DC

## 2018-06-23 ENCOUNTER — Other Ambulatory Visit: Payer: Self-pay

## 2018-06-23 MED ORDER — ALPRAZOLAM 1 MG PO TABS: ORAL_TABLET | ORAL | 0 refills | Status: DC

## 2018-06-23 NOTE — Telephone Encounter (Addendum)
Alprazolam refill request. PMP checked, last filled on 03/28/18. Last office visit on 06/07/18, next office visit on 09/09/18. In que for your review.

## 2018-08-31 ENCOUNTER — Other Ambulatory Visit: Payer: Self-pay

## 2018-08-31 MED ORDER — SERTRALINE HCL 100 MG PO TABS: ORAL_TABLET | ORAL | 0 refills | Status: DC

## 2018-09-08 NOTE — Progress Notes (Signed)
FOLLOW UP  Assessment and Plan:   Hypertension At goal at this time Monitor blood pressure at home; patient to call if consistently greater than 130/80 Continue DASH diet.   Reminder to go to the ER if any CP, SOB, nausea, dizziness, severe HA, changes vision/speech, left arm numbness and tingling and jaw pain.  Cholesterol Currently above goal; working on lifestyle Continue low cholesterol diet and exercise.  Check lipid panel.   Prediabetes Continue diet and exercise.  Perform daily foot/skin check, notify office of any concerning changes.  Check A1C  Morbid Obesity with co morbidities Long discussion about weight loss, diet, and exercise Recommended diet heavy in fruits and veggies and low in animal meats, cheeses, and dairy products, appropriate calorie intake Discussed ideal weight for height  Patient will work on restarting exercise, kickboxing, emphasized regular routine, protein+healthy fat+fiber Will restart phentermine due to hx of benefit/tolerating this; discussed my concerns with concurrent methylphenidate, xanax use; She reports Dr. Elisabeth Most is aware of current regimen- she will monitor symptoms of increased anxiety, insomnia, tachycardia closely, reminded to avoid daily use and take frequent drug breaks Will follow up closely in 3 months  Vitamin D Def Continue supplementation Check vitamin D level  Depression/anxiety Improved with reduced work stress, zoloft 200 mg, wellbutrin 300 mg daily Continue medications; reminded to limit use of xanax  Lifestyle discussed: diet/exerise, sleep hygiene, stress management, hydration  ADD Follow up with Dr. Elisabeth Most, Continue medications: methylphenidate Helps with focus, no AE's. The patient was counseled on the addictive nature of the medication and was encouraged to take drug holidays when not needed.   Continue diet and meds as discussed. Further disposition pending results of labs. Discussed med's effects and  SE's.   Over 30 minutes of exam, counseling, chart review, and critical decision making was performed.   Future Appointments  Date Time Provider Department Center  02/24/2019 10:30 AM Quentin Mulling, PA-C [facility] None    ----------------------------------------------------------------------------------------------------------------------  HPI 47 y.o. female  presents for 6 month follow up on hypertension, cholesterol, prediabetes, morbid obesity, depression/anxiety/ADD and vitamin D deficiency.   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    she has a diagnosis of major depression with anxiety/panic disorder/OCD and is currently on wellbutrin 300 mg, trazodone 150 mg PRN, xanax 1 mg BID PRN, she currently takes 2 a day x 4 days/week or so, and last visit lexapro was d/c'd and zoloft 100 mg daily was initiated. She has since titrated up to 200 mg daily per our discussion and she reports noting benefit with this. She also has ADD and is prescribed methylphenidate XR managed by Dr. Elisabeth Most.   she is morbidly obese, s/p gastric bypass in 2015 has been prescribed phentermine for weight loss, but we stopped this at last visit due to concerns of contributing to stress levels. She reports today would like to restart due to weight gain, and endorses improved stress symptoms. We discussed my concerns with concurrently taking methylphenidate, phentermine and with current xanax use. She expresses understanding, but feels strongly about restarting this as she has been prescribed this previously and has done well with these medications.   BMI is Body mass index is 46.03 kg/m., she is working on diet and exercise.  Wt Readings from Last 3 Encounters:  09/09/18 264 lb (119.7 kg)  06/07/18 259 lb (117.5 kg)  03/28/18 260 lb (117.9 kg)   Typical breakfast: 3 eggs, occasionally with toast  or croissant, black coffee with splenda AM  snack: rarely with have peanut butter crackers or fruit smoothie Typical lunch: gardenia meals or weight watcher meals Typical dinner: salad, burritos from [employer] Exercise: has been doing gym Water intake: has been drinking 1 diet coke daily, otherwise water, occasional 2% milk  Avoiding sugar Sleep: improved, less nighttime awakening, sleeping 7-7.5 hours daily, endorses waking up feeling rested  Her blood pressure has been controlled at home, today their BP is BP: 124/68  She does workout. She denies chest pain, shortness of breath, dizziness.   She is not on cholesterol medication and denies myalgias. Her cholesterol is not at goal. The cholesterol last visit was:   Lab Results  Component Value Date   CHOL 209 (H) 06/07/2018   HDL 53 06/07/2018   LDLCALC 134 (H) 06/07/2018   TRIG 113 06/07/2018   CHOLHDL 3.9 06/07/2018    She has been working on diet and exercise for prediabetes, and denies foot ulcerations, increased appetite, nausea, paresthesia of the feet, polydipsia, polyuria, visual disturbances, vomiting and weight loss. Last A1C in the office was:  Lab Results  Component Value Date   HGBA1C 6.0 (H) 06/07/2018   Patient is on Vitamin D supplement.   Lab Results  Component Value Date   VD25OH 43 02/17/2017        Current Medications:  Current Outpatient Medications on File Prior to Visit  Medication Sig  . albuterol (VENTOLIN HFA) 108 (90 Base) MCG/ACT inhaler Inhale 2 puffs into the lungs every 4 (four) hours as needed for wheezing or shortness of breath. Make sure you rinse mouth out after each use.  Marland Kitchen. ALPRAZolam (XANAX) 1 MG tablet take 1 tablet by mouth two times a day if needed for panic attack, try not to take daily, this medication is addictive.  Marland Kitchen. buPROPion (WELLBUTRIN XL) 300 MG 24 hr tablet Take one tablet by mouth daily  . calcium citrate-vitamin D (CITRACAL+D) 315-200 MG-UNIT per tablet Take by mouth.  . Cyanocobalamin (VITAMIN B-12) 5000 MCG SUBL Place  1 tablet under the tongue daily.  . methylphenidate (CONCERTA) 36 MG PO CR tablet Take 36 mg by mouth daily. Take two tablets by mouth daily  . phentermine (ADIPEX-P) 37.5 MG tablet TAKE 1 TABLET BEFORE BREAKFAST  . sertraline (ZOLOFT) 100 MG tablet Take 1 tablet daily for Mood  . traZODone (DESYREL) 150 MG tablet Take 1 tablet (150 mg total) by mouth daily with breakfast.   No current facility-administered medications on file prior to visit.      Allergies:  Allergies  Allergen Reactions  . Keflex [Cephalexin] Swelling and Hives     Medical History:  Past Medical History:  Diagnosis Date  . ADD (attention deficit disorder)   . Allergy   . Anxiety   . Asthma   . Depression   . GERD (gastroesophageal reflux disease)   . Gestational diabetes   . Hyperlipidemia   . Infection    UTI  . Obesity   . OCD (obsessive compulsive disorder)   . Ovarian cyst   . Peripheral neuropathy   . Preterm labor   . Type II or unspecified type diabetes mellitus without mention of complication, not stated as uncontrolled    Family history- Reviewed and unchanged Social history- Reviewed and unchanged   Review of Systems:  Review of Systems  Constitutional: Negative for malaise/fatigue and weight loss.  HENT: Negative for hearing loss and tinnitus.   Eyes: Negative for blurred vision and double  vision.  Respiratory: Negative for cough, shortness of breath and wheezing.   Cardiovascular: Negative for chest pain, palpitations, orthopnea, claudication and leg swelling.  Gastrointestinal: Negative for abdominal pain, blood in stool, constipation, diarrhea, heartburn, melena, nausea and vomiting.  Genitourinary: Negative.   Musculoskeletal: Negative for joint pain and myalgias.  Skin: Negative for rash.  Neurological: Negative for dizziness, tingling, sensory change, weakness and headaches.  Endo/Heme/Allergies: Negative for polydipsia.  Psychiatric/Behavioral: Positive for depression. The  patient is nervous/anxious and has insomnia.   All other systems reviewed and are negative.    Physical Exam: BP 124/68   Pulse 89   Temp 97.7 F (36.5 C)   Ht 5' 3.5" (1.613 m)   Wt 264 lb (119.7 kg)   SpO2 98%   BMI 46.03 kg/m  Wt Readings from Last 3 Encounters:  09/09/18 264 lb (119.7 kg)  06/07/18 259 lb (117.5 kg)  03/28/18 260 lb (117.9 kg)   General Appearance: Well nourished, in no apparent distress. Eyes: PERRLA, EOMs, conjunctiva no swelling or erythema Sinuses: No Frontal/maxillary tenderness ENT/Mouth: Ext aud canals clear, TMs without erythema, bulging. No erythema, swelling, or exudate on post pharynx.  Tonsils not swollen or erythematous. Hearing normal.  Neck: Supple, thyroid normal.  Respiratory: Respiratory effort normal, BS equal bilaterally without rales, rhonchi, wheezing or stridor.  Cardio: RRR with no MRGs. Brisk peripheral pulses without edema.  Abdomen: Soft, + BS.  Non tender, no guarding, rebound, hernias, masses. Lymphatics: Non tender without lymphadenopathy.  Musculoskeletal: Full ROM, 5/5 strength, Normal gait Skin: Warm, dry without rashes, lesions, ecchymosis.  Neuro: Cranial nerves intact. No cerebellar symptoms.  Psych: Awake and oriented X 3, normal affect, Insight and Judgment appropriate.    Dan Maker, NP 12:20 PM [facility]

## 2018-09-09 ENCOUNTER — Ambulatory Visit (INDEPENDENT_AMBULATORY_CARE_PROVIDER_SITE_OTHER): Admitting: Adult Health

## 2018-09-09 ENCOUNTER — Encounter: Payer: Self-pay | Admitting: Adult Health

## 2018-09-09 VITALS — BP 124/68 | HR 89 | Temp 97.7°F | Ht 63.5 in | Wt 264.0 lb

## 2018-09-09 DIAGNOSIS — F3341 Major depressive disorder, recurrent, in partial remission: Secondary | ICD-10-CM

## 2018-09-09 DIAGNOSIS — F909 Attention-deficit hyperactivity disorder, unspecified type: Secondary | ICD-10-CM

## 2018-09-09 DIAGNOSIS — F429 Obsessive-compulsive disorder, unspecified: Secondary | ICD-10-CM | POA: Diagnosis not present

## 2018-09-09 DIAGNOSIS — R7303 Prediabetes: Secondary | ICD-10-CM

## 2018-09-09 DIAGNOSIS — E785 Hyperlipidemia, unspecified: Secondary | ICD-10-CM | POA: Diagnosis not present

## 2018-09-09 DIAGNOSIS — Z79899 Other long term (current) drug therapy: Secondary | ICD-10-CM

## 2018-09-09 DIAGNOSIS — I10 Essential (primary) hypertension: Secondary | ICD-10-CM

## 2018-09-09 DIAGNOSIS — E559 Vitamin D deficiency, unspecified: Secondary | ICD-10-CM

## 2018-09-09 DIAGNOSIS — F410 Panic disorder [episodic paroxysmal anxiety] without agoraphobia: Secondary | ICD-10-CM

## 2018-09-09 DIAGNOSIS — Z6841 Body Mass Index (BMI) 40.0 and over, adult: Secondary | ICD-10-CM

## 2018-09-09 DIAGNOSIS — F064 Anxiety disorder due to known physiological condition: Secondary | ICD-10-CM

## 2018-09-09 NOTE — Patient Instructions (Signed)
Who Qualifies for Obesity Medications? Although everyone is hopeful for a fast and easy way to lose weight, nothing has been shown to replace a prudent, calorie-controlled diet along with behavior modification as a cornerstone for all obesity treatments.  The next tool that can be used to achieve weight-loss and health improvement is medication. Pharmacotherapy may be offered to individuals affected by obesity who have failed to achieve weight-loss through diet and exercise alone. Currently there are several drugs that are approved by the FDA for weight-loss: phentermine products (Adipex-P or Suprenza)  lorcaserin HCI (Belviq) phentermine- topiramate ER (Qsymia)  Bupropion; Naltrexone ER (Contrave)  Saxenda (liraglutide), once a day weight loss shot Let's take a closer look at each of these medications and learn how they work:  Phentermine (Adipex-P or Suprenza) How does it work? Phentermine is a medication available by prescription that works on chemicals in the brain to decrease your appetite. It also has a mild stimulant component that adds extra energy. Phentermine is a pill that is taken once a day in the morning time. Tolerance to this medication can develop, so it can only be used for several months at a time. Common side effects are dry mouth, sleeplessness, constipation. Weight-loss: The average weight-loss is 4-5 percent of your weight after one-year. In a 200 pound person, this means about 10 pounds of weight-loss. Patients who receive phentermine can usually expect to see greater weight-loss than those who receive non-pharmacologic care, on average about 13 pounds difference over 12 weeks as reported in one study. Concerns: Due to its stimulant effect, a person's blood pressure and heart rate may increase when on this medication; therefore, you must be monitored closely by a physician who is experienced in prescribing this medication. It cannot be used in patients with some heart  conditions (such as poorly controlled blood pressure), glaucoma (increased pressure in your eye), stroke or overactive thyroid. There is some concern for abuse, but this is minimal if the medication is appropriately used as directed by a healthcare professional.  Lorcaserin (Belviq) How does it work? Lorcaserin was approved in June 2012 by the FDA and became commercially available in June 2013. It works by helping you feel full while eating less, and it works on the chemicals in your brain to help decrease your appetite. Weight-loss: In individuals who took the medication for one-year, it has been shown to have an average of 7 percent weight-loss. In a 200 pound person, this would mean a 14 pound weight-loss. Blood sugar, cholesterol and blood pressure levels have also been shown to improve. Concerns: The most common side effects are headache, dizziness, fatigue, dry mouth, upper res:piratory tract infection and nausea.  Response to therapy should be evaluated by week 12.  If a patient has not lost at least 5% of baseline body weigh  Phentermine-Topiramate ER (Qsymia) How does it work? This combination medication was approved by the FDA in July 2012. Topiramate is a medication used to treat seizures. It was found that a common side effect of this medication was weight-loss. Phentermine, as described in this brochure, helps to increase your energy and decrease your appetite. Weight-loss: Among individuals who took the highest does of Qsymia (15 mg phentermine and 92 mg of topiramate ER) for one-year, they achieved an average of 14.4 percent weight-loss. In a 200 pound person, a 14.4 percent weight-loss would mean a loss of 29 pounds. Cholesterol levels have also been shown to improve. Concerns: The most common side effects were dry mouth, constipation   and pins-and-needle feeling in extremities. Qsymia should NOT be taken during pregnancy since Topiramate ER, a component of Qsymia, has been  associated with an increased risk of birth defects.  Bupropion; Naltrexone ER (Contrave) How does it work? Works in two areas of your brain, hunger center and reward center to reduce hunger and cravings.  Weight loss In a 56 week trial patients lost more than 5% of their body weight.  Concerns Most common side effects are dry mouth, constipation or diarrhea, headache.  Please take it with a full glass of water and low fat meal.      Follow-up Visits: Patients are given the opportunity to revisit a topic or obtain more information on an area of interest during follow-up visits. The frequency of and interval between follow-up visits is determined on a patient-by-patient basis. Frequent visits (every 3 to 4 weeks) are encouraged until initial weight-loss goals (5 to 10 percent of body weight) are achieved. At that point, less frequent visits are typically scheduled as needed for individual patients. However, since obesity is considered a chronic life-long problem for many individuals, periodic continual follow up is recommended.   Research has shown that weight-loss as low as 5 percent of initial body weight can lead to favorable improvements in blood pressure, cholesterol, glucose levels and insulin sensitivity. The risk of developing heart disease is reduced the most in patients who have impaired glucose tolerance, type 2 diabetes or high blood pressure.    

## 2018-09-10 LAB — CBC WITH DIFFERENTIAL/PLATELET
BASOS PCT: 0.4 %
Basophils Absolute: 28 cells/uL (ref 0–200)
Eosinophils Absolute: 90 cells/uL (ref 15–500)
Eosinophils Relative: 1.3 %
HCT: 38.1 % (ref 35.0–45.0)
Hemoglobin: 12.7 g/dL (ref 11.7–15.5)
Lymphs Abs: 2401 cells/uL (ref 850–3900)
MCH: 28 pg (ref 27.0–33.0)
MCHC: 33.3 g/dL (ref 32.0–36.0)
MCV: 83.9 fL (ref 80.0–100.0)
MPV: 11 fL (ref 7.5–12.5)
Monocytes Relative: 5.4 %
Neutro Abs: 4009 cells/uL (ref 1500–7800)
Neutrophils Relative %: 58.1 %
PLATELETS: 305 10*3/uL (ref 140–400)
RBC: 4.54 10*6/uL (ref 3.80–5.10)
RDW: 13.9 % (ref 11.0–15.0)
TOTAL LYMPHOCYTE: 34.8 %
WBC: 6.9 10*3/uL (ref 3.8–10.8)
WBCMIX: 373 {cells}/uL (ref 200–950)

## 2018-09-10 LAB — COMPLETE METABOLIC PANEL WITH GFR
AG RATIO: 1.4 (calc) (ref 1.0–2.5)
ALT: 19 U/L (ref 6–29)
AST: 19 U/L (ref 10–35)
Albumin: 4 g/dL (ref 3.6–5.1)
Alkaline phosphatase (APISO): 45 U/L (ref 33–115)
BILIRUBIN TOTAL: 0.3 mg/dL (ref 0.2–1.2)
BUN: 13 mg/dL (ref 7–25)
CO2: 31 mmol/L (ref 20–32)
Calcium: 9.3 mg/dL (ref 8.6–10.2)
Chloride: 103 mmol/L (ref 98–110)
Creat: 0.85 mg/dL (ref 0.50–1.10)
GFR, EST NON AFRICAN AMERICAN: 82 mL/min/{1.73_m2} (ref >=60)
GFR, Est African American: 95 mL/min/{1.73_m2} (ref >=60)
Globulin: 2.8 g/dL (calc) (ref 1.9–3.7)
Glucose, Bld: 77 mg/dL (ref 65–99)
POTASSIUM: 4.7 mmol/L (ref 3.5–5.3)
Sodium: 139 mmol/L (ref 135–146)
Total Protein: 6.8 g/dL (ref 6.1–8.1)

## 2018-09-10 LAB — TSH: TSH: 1.12 m[IU]/L

## 2018-09-10 LAB — LIPID PANEL
Cholesterol: 225 mg/dL — ABNORMAL HIGH (ref <200)
HDL: 64 mg/dL (ref >50)
LDL Cholesterol (Calc): 143 mg/dL (calc) — ABNORMAL HIGH
NON-HDL CHOLESTEROL (CALC): 161 mg/dL — AB (ref <130)
TRIGLYCERIDES: 79 mg/dL (ref <150)
Total CHOL/HDL Ratio: 3.5 (calc) (ref <5.0)

## 2018-09-10 LAB — HEMOGLOBIN A1C
HEMOGLOBIN A1C: 6 %{Hb} — AB (ref <5.7)
MEAN PLASMA GLUCOSE: 126 (calc)
eAG (mmol/L): 7 (calc)

## 2018-09-10 LAB — VITAMIN D 25 HYDROXY (VIT D DEFICIENCY, FRACTURES): VIT D 25 HYDROXY: 37 ng/mL (ref 30–100)

## 2018-09-12 ENCOUNTER — Other Ambulatory Visit: Payer: Self-pay | Admitting: Adult Health

## 2018-09-12 MED ORDER — ROSUVASTATIN CALCIUM 5 MG PO TABS: ORAL_TABLET | ORAL | 1 refills | Status: AC

## 2018-09-25 ENCOUNTER — Other Ambulatory Visit: Payer: Self-pay | Admitting: Adult Health

## 2018-10-10 ENCOUNTER — Other Ambulatory Visit: Payer: Self-pay | Admitting: Adult Health

## 2018-10-10 ENCOUNTER — Other Ambulatory Visit: Payer: Self-pay

## 2018-10-10 MED ORDER — PHENTERMINE HCL 37.5 MG PO TABS: ORAL_TABLET | ORAL | 2 refills | Status: DC

## 2018-10-10 MED ORDER — ALPRAZOLAM 1 MG PO TABS: ORAL_TABLET | ORAL | 0 refills | Status: AC

## 2018-10-10 MED ORDER — SERTRALINE HCL 100 MG PO TABS: ORAL_TABLET | ORAL | 1 refills | Status: DC

## 2018-11-01 ENCOUNTER — Other Ambulatory Visit: Payer: Self-pay | Admitting: Adult Health

## 2018-11-01 DIAGNOSIS — Z0001 Encounter for general adult medical examination with abnormal findings: Secondary | ICD-10-CM

## 2019-02-09 ENCOUNTER — Other Ambulatory Visit: Payer: Self-pay | Admitting: Adult Health

## 2019-02-09 DIAGNOSIS — Z0001 Encounter for general adult medical examination with abnormal findings: Secondary | ICD-10-CM

## 2019-02-16 ENCOUNTER — Other Ambulatory Visit: Payer: Self-pay

## 2019-02-23 ENCOUNTER — Encounter: Payer: Self-pay | Admitting: Physician Assistant

## 2019-02-24 ENCOUNTER — Encounter: Payer: Self-pay | Admitting: Physician Assistant

## 2019-04-13 ENCOUNTER — Other Ambulatory Visit: Payer: Self-pay | Admitting: Adult Health

## 2019-04-20 ENCOUNTER — Other Ambulatory Visit: Payer: Self-pay

## 2019-04-20 NOTE — Telephone Encounter (Signed)
Refill request for Sertraline

## 2019-04-21 MED ORDER — SERTRALINE HCL 100 MG PO TABS: ORAL_TABLET | ORAL | 0 refills | Status: DC

## 2019-05-06 ENCOUNTER — Other Ambulatory Visit: Payer: Self-pay | Admitting: Adult Health

## 2019-07-03 ENCOUNTER — Other Ambulatory Visit: Payer: Self-pay | Admitting: Adult Health
